# Patient Record
Sex: Female | Born: 2000 | Race: White | Hispanic: No | Marital: Single | Smoking: Never smoker
Health system: Southern US, Community
[De-identification: ages and names within clinical notes are randomized; demographics above are authoritative.]

## PROBLEM LIST (undated history)

## (undated) DIAGNOSIS — F329 Major depressive disorder, single episode, unspecified: Secondary | ICD-10-CM

## (undated) DIAGNOSIS — F4310 Post-traumatic stress disorder, unspecified: Secondary | ICD-10-CM

## (undated) DIAGNOSIS — F419 Anxiety disorder, unspecified: Secondary | ICD-10-CM

## (undated) DIAGNOSIS — J45909 Unspecified asthma, uncomplicated: Secondary | ICD-10-CM

## (undated) DIAGNOSIS — F32A Depression, unspecified: Secondary | ICD-10-CM

## (undated) HISTORY — PX: ADENOIDECTOMY: SUR15

## (undated) HISTORY — DX: Anxiety disorder, unspecified: F41.9

## (undated) HISTORY — DX: Depression, unspecified: F32.A

## (undated) HISTORY — DX: Post-traumatic stress disorder, unspecified: F43.10

## (undated) HISTORY — PX: TONSILLECTOMY: SUR1361

---
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
## 2022-01-12 ENCOUNTER — Ambulatory Visit (INDEPENDENT_AMBULATORY_CARE_PROVIDER_SITE_OTHER): Payer: Medicaid Other | Admitting: Psychiatry

## 2022-01-12 DIAGNOSIS — F4310 Post-traumatic stress disorder, unspecified: Secondary | ICD-10-CM | POA: Diagnosis not present

## 2022-01-12 NOTE — Progress Notes (Signed)
Virtual Visit via Video Note ? ?I connected with Tabitha Fitzpatrick on 01/12/22 at 1:08 PM EDT by a video enabled telemedicine application and verified that I am speaking with the correct person using two identifiers. ? ?Location: ?Patient:Home ?Provider: [facility] [place] office  ?  ?I discussed the limitations of evaluation and management by telemedicine and the availability of in person appointments. The patient expressed understanding and agreed to proceed. ? ? ?I provided 50 minutes of non-face-to-face time during this encounter. ? ? ?Leilanie Rauda E Filiberto Wamble, LCSW ? ? ? ?THERAPIST PROGRESS NOTE ? ?Session Time: Monday 01/12/2022  1:08 PM - 1:58 PM ? ?Participation Level: Active ? ?Behavioral Response: CasualAlertAnxious and Depressed ? ?Type of Therapy: Individual Therapy ? ?Treatment Goals addressed: [name] will score less than 10 on the patient health questionnaire/[name] will participate in pleasant activities 3 times per week for the next 12 weeks ? ?ProgressTowards Goals: not progressing ? ?Interventions: CBT and Supportive ? ?Summary: Tabitha Fitzpatrick is a 21 y.o. female who is referred for services by psychiatrist Dr. Tenny Craw due to patient experiencing anxiety, stress, and effects of trauma history she denies any psychiatric hospitalizations.  She participated [facility] and last was seen about 4 years ago.  Patient states needing someone to talk to due to anxiety and depression.  She reports stress related to her current boyfriend.  Current symptoms include crying spells, staying in bed at times, and social withdrawal. ? ?Patient last was seen via virtual visit about 2 weeks ago.  Patient reports increased symptoms of depression as reflected in PHQ-9.  Per patient's report, trigger is negative comments from her ex-boyfriend.  Patient reports this triggered increased negative thoughts about self.  She verbalizes thoughts of not being good enough.  She reports these thoughts also are  triggered by her parents expectations of her now that she is an adult.  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  She reports continued regular attendance at work but reports little to no involvement in other activity.   ? ?Suicidal/Homicidal: Nowithout intent/plan ? ?Therapist Response: Reviewed symptoms, administered PHQ-9, assisted patient identify triggers of increased symptoms of depression, discussed stressors, facilitated expression of thoughts and feelings, validated feelings, began to assist patient identify the connection between thoughts/mood/behavior using examples from her life, began to assist patient examine her thought patterns and effects on her current functioning, discussed role of behavioral activation in overcoming depression, developed plan with patient to participate in 1 pleasant activity per day 5 days/week, therapist will send patient activity menu handout to assist patient in her efforts, also discussed the role of medication in coping with depression, developed plan with patient to schedule medication management appointment with psychiatrist Dr. Tenny Craw  ?Plan: Return again in 2 weeks. ? ?Diagnosis: PTSD (post-traumatic stress disorder) ? ?Collaboration of Care: Psychiatrist AEB by patient working with psychiatrist Dr. Tenny Craw, clinician encouraging patient to schedule follow-up appointment. ? ?Patient/Guardian was advised Release of Information must be obtained prior to any record release in order to collaborate their care with an outside provider. Patient/Guardian was advised if they have not already done so to contact the registration department to sign all necessary forms in order for [place] to release information regarding their care.  ? ?Consent: Patient/Guardian gives verbal consent for treatment and assignment of benefits for services provided during this visit. Patient/Guardian expressed  understanding and agreed to proceed.  ? ?  Shamina Etheridge E Tal Neer, LCSW ?01/12/2022 ? ?

## 2022-01-26 ENCOUNTER — Ambulatory Visit (HOSPITAL_COMMUNITY): Payer: Medicaid Other | Admitting: Psychiatry

## 2022-01-26 ENCOUNTER — Telehealth (HOSPITAL_COMMUNITY): Payer: Self-pay | Admitting: Psychiatry

## 2022-01-26 NOTE — Telephone Encounter (Signed)
Therapist attempted to contact patient via text through caregility platform for scheduled appointment, no response.  Therapist left message indicating attempt and requesting patient call office. ?

## 2022-02-03 ENCOUNTER — Encounter: Payer: Self-pay | Admitting: Cardiology

## 2022-02-05 ENCOUNTER — Encounter: Payer: Self-pay | Admitting: *Deleted

## 2022-02-13 ENCOUNTER — Telehealth: Payer: Self-pay | Admitting: Cardiology

## 2022-02-13 NOTE — Telephone Encounter (Signed)
Pt calling to f/u on heart monitor results. Pt states that she also has been experiencing light headedness as well. Please advise ?

## 2022-02-13 NOTE — Telephone Encounter (Signed)
Addressed in results encounter. 

## 2022-02-13 NOTE — Telephone Encounter (Signed)
Addressed in result note.  

## 2022-02-13 NOTE — Telephone Encounter (Signed)
Left message for patient to call office.  

## 2022-07-01 NOTE — Progress Notes (Signed)
No show

## 2022-09-02 ENCOUNTER — Ambulatory Visit
Admission: EM | Admit: 2022-09-02 | Discharge: 2022-09-02 | Disposition: A | Discharge status: Home / Self Care | Payer: PRIVATE HEALTH INSURANCE | Attending: Nurse Practitioner | Admitting: Nurse Practitioner

## 2022-09-02 DIAGNOSIS — J029 Acute pharyngitis, unspecified: Secondary | ICD-10-CM | POA: Diagnosis present

## 2022-09-02 DIAGNOSIS — B001 Herpesviral vesicular dermatitis: Secondary | ICD-10-CM | POA: Diagnosis not present

## 2022-09-02 DIAGNOSIS — J069 Acute upper respiratory infection, unspecified: Secondary | ICD-10-CM | POA: Insufficient documentation

## 2022-09-02 LAB — POCT RAPID STREP A (OFFICE): Rapid Strep A Screen: NEGATIVE

## 2022-09-02 MED ORDER — LIDOCAINE VISCOUS HCL 2 % MT SOLN: 10.0000 mL | Freq: Four times a day (QID) | OROMUCOSAL | 0 refills | Status: DC | PRN

## 2022-09-02 MED ORDER — DEXAMETHASONE SODIUM PHOSPHATE 10 MG/ML IJ SOLN
10.0000 mg | INTRAMUSCULAR | Status: AC
  Administered 2022-09-02: 10 mg via INTRAMUSCULAR

## 2022-09-02 MED ORDER — VALACYCLOVIR HCL 1 G PO TABS: 2.0000 g | ORAL_TABLET | Freq: Once | ORAL | 0 refills | Status: AC

## 2022-09-02 MED ORDER — ALBUTEROL SULFATE HFA 108 (90 BASE) MCG/ACT IN AERS: 2.0000 | INHALATION_SPRAY | Freq: Four times a day (QID) | RESPIRATORY_TRACT | 2 refills | Status: DC | PRN

## 2022-09-02 MED ORDER — PREDNISONE 20 MG PO TABS: 40.0000 mg | ORAL_TABLET | Freq: Every day | ORAL | 0 refills | Status: AC

## 2022-09-02 NOTE — ED Provider Notes (Signed)
[facility]    CSN: [id number] Arrival date & time: 09/02/22  1256      History   Chief Complaint Chief Complaint  Patient presents with   Sore Throat    I have a sore throat, congestion, been running fevers, my o2 levels have been around 96. I have been coughing.  I woke this am with mouth blisters.  I have been test for flu, strep and Covid.  They have all been negative.  I'm still sick. - Entered by patient    HPI Tabitha Fitzpatrick is a 21 y.o. female.   The history is provided by the patient and a parent.   Patient presents with her mother for complaints of fever, sore throat, cough, mouth and tongue pain, that is been present for the past week.  Patient's dates she was seen by her PCP and started on penicillin for her symptoms.  Patient states her symptoms are getting worse.  She states her last fever was approximately 2 days ago.  She states that this morning when she woke up, she had difficulty catching her breath.  She states that she was given a steroid shot in the office as well, which she did feel better, but then started feeling worse.  She states that she tried to eat last evening, but cannot.  Patient states that she has difficulty eating and drinking, and opening her mouth. Past Medical History:  Diagnosis Date   Anxiety    Asthma    mild, no inhalers at home   Depression    PTSD (post-traumatic stress disorder)     Patient Active Problem List   Diagnosis Date Noted   Anxiety 01/13/2018   Hives 01/13/2018   Irregular menses 01/13/2018   Scoliosis concern 01/13/2018   Colles' fracture of right radius 05/11/2013   Hypovolemia dehydration 07/06/2012    Past Surgical History:  Procedure Laterality Date   ADENOIDECTOMY     age 58 years   TONSILLECTOMY     age 45 years    OB History   No obstetric history on file.      Home Medications    Prior to Admission medications   Medication Sig Start Date End Date Taking? Authorizing Provider   albuterol (VENTOLIN HFA) 108 (90 Base) MCG/ACT inhaler Inhale 2 puffs into the lungs every 6 (six) hours as needed for wheezing or shortness of breath. 09/02/22  Yes Airel Magadan-Warren, Sadie Haber, NP  lidocaine (XYLOCAINE) 2 % solution Use as directed 10 mLs in the mouth or throat every 6 (six) hours as needed for mouth pain. 09/02/22  Yes Fany Cavanaugh-Warren, Sadie Haber, NP  predniSONE (DELTASONE) 20 MG tablet Take 2 tablets (40 mg total) by mouth daily with breakfast for 5 days. 09/02/22 09/07/22 Yes Catheryn Slifer-Warren, Sadie Haber, NP  valACYclovir (VALTREX) 1000 MG tablet Take 2 tablets (2,000 mg total) by mouth once for 1 dose. 09/02/22 09/02/22 Yes Macey Wurtz-Warren, Sadie Haber, NP  hydrOXYzine (ATARAX/VISTARIL) 10 MG tablet Take 1 tablet (10 mg total) by mouth 3 (three) times daily as needed for anxiety. 08/14/21   Myrlene Broker, MD  prazosin (MINIPRESS) 5 MG capsule Take 1 capsule (5 mg total) by mouth at bedtime. 08/14/21   Myrlene Broker, MD  sertraline (ZOLOFT) 50 MG tablet Take 1.5 tablets (75 mg total) by mouth daily. 08/14/21 08/14/22  Myrlene Broker, MD    Family History Family History  Problem Relation Age of Onset   Cancer Mother  cervical cancer - remission now, hysterectomy   Anxiety disorder Mother    Hypertension Father        father also has reflux   Diabetes Maternal Grandmother    Hypertension Maternal Grandmother    Diabetes Paternal Grandmother    Hypertension Paternal Grandmother     Social History Social History   Tobacco Use   Smoking status: Never   Smokeless tobacco: Never   Tobacco comments:    No smoker in the home, no alcohol either.  Substance Use Topics   Alcohol use: No   Drug use: No     Allergies   Patient has no known allergies.   Review of Systems Review of Systems Per HPI  Physical Exam Triage Vital Signs ED Triage Vitals [09/02/22 1439]  Enc Vitals Group     BP 129/81     Pulse Rate (!) 126     Resp 20     Temp 99.7 F (37.6 C)      Temp Source Oral     SpO2 98 %     Weight      Height      Head Circumference      Peak Flow      Pain Score      Pain Loc      Pain Edu?      Excl. in GC?    No data found.  Updated Vital Signs BP 129/81 (BP Location: Right Arm)   Pulse (!) 126   Temp 99.7 F (37.6 C) (Oral)   Resp 20   LMP 08/27/2022 (Exact Date)   SpO2 98%   Visual Acuity Right Eye Distance:   Left Eye Distance:   Bilateral Distance:    Right Eye Near:   Left Eye Near:    Bilateral Near:     Physical Exam Vitals and nursing note reviewed.  Constitutional:      General: She is not in acute distress.    Appearance: She is well-developed.  HENT:     Head: Normocephalic.     Right Ear: Tympanic membrane and ear canal normal.     Left Ear: Tympanic membrane and ear canal normal.     Nose: No congestion or rhinorrhea.     Mouth/Throat:     Lips: Lesions present.     Mouth: Oral lesions present.     Dentition: Gum lesions present.     Tongue: Lesions present.     Pharynx: Uvula midline. No oropharyngeal exudate.  Cardiovascular:     Rate and Rhythm: Normal rate and regular rhythm.     Heart sounds: Normal heart sounds.  Pulmonary:     Effort: Pulmonary effort is normal.     Breath sounds: Normal breath sounds.  Abdominal:     General: Bowel sounds are normal.     Palpations: Abdomen is soft.     Tenderness: There is no abdominal tenderness.  Musculoskeletal:     Cervical back: Normal range of motion.  Lymphadenopathy:     Cervical: Cervical adenopathy present.  Skin:    General: Skin is warm and dry.  Neurological:     General: No focal deficit present.     Mental Status: She is alert and oriented to person, place, and time.  Psychiatric:        Mood and Affect: Mood normal.        Behavior: Behavior normal.      UC Treatments / Results  Labs (all labs ordered are  listed, but only abnormal results are displayed) Labs Reviewed  CULTURE, GROUP A STREP [facility]  POCT RAPID STREP A  (OFFICE)    EKG   Radiology No results found.  Procedures Procedures (including critical care time)  Medications Ordered in UC Medications  dexamethasone (DECADRON) injection 10 mg (10 mg Intramuscular Given 09/02/22 1546)    Initial Impression / Assessment and Plan / UC Course  I have reviewed the triage vital signs and the nursing notes.  Pertinent labs & imaging results that were available during my care of the patient were reviewed by me and considered in my medical decision making (see chart for details).  Patient presents for continued sore throat, cough, fever, and congestion.  She has also developed new oral lesions.  Patient's vital signs are stable, although she is tachycardic, she is in no acute distress.  Patient is currently taking penicillin for her symptoms.  This is the first-line treatment recommendation for the patient's symptoms, rapid strep test was repeated along with a throat culture that is pending.  Patient was advised that no changes to her medication at this time.  For her throat pain and discomfort, viscous lidocaine 2% was prescribed, and prednisone 40 mg was prescribed to help with throat pain and inflammation.  For her oral lesions, patient was prescribed valacyclovir 2 g.  For the patient's cough, she was prescribed an albuterol inhaler.  Supportive care recommendations were provided to the patient.  Patient verbalizes understanding.  All questions were answered.  Patient advised to follow-up with her primary care physician if symptoms do not improve with this treatment.  Patient is stable for discharge. Final Clinical Impressions(s) / UC Diagnoses   Final diagnoses:  Acute pharyngitis, unspecified etiology  Herpes labialis without complication  Acute upper respiratory infection     Discharge Instructions      The rapid strep test is negative.  A throat culture has been ordered.  There is a likelihood or chance that the throat culture will be negative  since she is currently taking penicillin. The most appropriate treatment at this time as far as antibiotic therapy is to continue the penicillin she is currently taking.  I have provided symptomatic treatment for her today. Take medications as prescribed. Increase fluids and allow for plenty of rest. Recommend a soft diet while symptoms persist, this includes soup, broth, yogurt, pudding, Jell-O, and popsicles. Warm salt water gargles 3-4 times daily while symptoms persist.  This is also helpful for you to use to help with mouth pain or discomfort. If the symptoms continue to persist after you have completed the antibiotic treatment, please follow-up with your primary care physician for further evaluation. Follow-up as needed.     ED Prescriptions     Medication Sig Dispense Auth. Provider   predniSONE (DELTASONE) 20 MG tablet Take 2 tablets (40 mg total) by mouth daily with breakfast for 5 days. 10 tablet Samai Corea-Warren, Sadie Haberhristie J, NP   lidocaine (XYLOCAINE) 2 % solution Use as directed 10 mLs in the mouth or throat every 6 (six) hours as needed for mouth pain. 100 mL Jasan Doughtie-Warren, Sadie Haberhristie J, NP   albuterol (VENTOLIN HFA) 108 (90 Base) MCG/ACT inhaler Inhale 2 puffs into the lungs every 6 (six) hours as needed for wheezing or shortness of breath. 8 g Tyreon Frigon-Warren, Sadie Haberhristie J, NP   valACYclovir (VALTREX) 1000 MG tablet Take 2 tablets (2,000 mg total) by mouth once for 1 dose. 2 tablet Damario Gillie-Warren, Sadie Haberhristie J, NP      PDMP  not reviewed this encounter.   Abran Cantor, NP 09/02/22 [phone]

## 2022-09-02 NOTE — Discharge Instructions (Addendum)
The rapid strep test is negative.  A throat culture has been ordered.  There is a likelihood or chance that the throat culture will be negative since she is currently taking penicillin. The most appropriate treatment at this time as far as antibiotic therapy is to continue the penicillin she is currently taking.  I have provided symptomatic treatment for her today. Take medications as prescribed. Increase fluids and allow for plenty of rest. Recommend a soft diet while symptoms persist, this includes soup, broth, yogurt, pudding, Jell-O, and popsicles. Warm salt water gargles 3-4 times daily while symptoms persist.  This is also helpful for you to use to help with mouth pain or discomfort. If the symptoms continue to persist after you have completed the antibiotic treatment, please follow-up with your primary care physician for further evaluation. Follow-up as needed.

## 2022-09-02 NOTE — ED Triage Notes (Signed)
Pt has a sore throat, congestion, been running fevers, her o2 levels have been around 96. She been coughing. She woke up with blisters on her tongue and upper mouth., . She has been test for flu, strep and Covid. They have all been negative. I'm still sick.  This has been going on for almost a week. Took penicillin but no relief.

## 2022-09-05 LAB — CULTURE, GROUP A STREP (THRC)

## 2022-09-30 ENCOUNTER — Emergency Department (HOSPITAL_COMMUNITY)
Admission: EM | Admit: 2022-09-30 | Discharge: 2022-09-30 | Discharge status: Against Medical Advice | Payer: PRIVATE HEALTH INSURANCE | Attending: Emergency Medicine | Admitting: Emergency Medicine

## 2022-09-30 ENCOUNTER — Other Ambulatory Visit: Payer: Self-pay

## 2022-09-30 ENCOUNTER — Encounter (HOSPITAL_COMMUNITY): Payer: Self-pay

## 2022-09-30 DIAGNOSIS — R1032 Left lower quadrant pain: Secondary | ICD-10-CM | POA: Diagnosis not present

## 2022-09-30 DIAGNOSIS — Z5321 Procedure and treatment not carried out due to patient leaving prior to being seen by health care provider: Secondary | ICD-10-CM | POA: Insufficient documentation

## 2022-09-30 NOTE — ED Notes (Signed)
Not in waiting area.  

## 2022-09-30 NOTE — ED Triage Notes (Signed)
Patient states left sided flank pain that feels like a kidney stone. Patient states she had this same feeling a couple months ago and states it is hard for her to walk or stand up straight.

## 2022-11-09 ENCOUNTER — Emergency Department (HOSPITAL_COMMUNITY): Payer: PRIVATE HEALTH INSURANCE

## 2022-11-09 ENCOUNTER — Other Ambulatory Visit: Payer: Self-pay

## 2022-11-09 ENCOUNTER — Ambulatory Visit
Admission: RE | Admit: 2022-11-09 | Discharge: 2022-11-09 | Disposition: A | Discharge status: Home / Self Care | Payer: Medicaid Other | Source: Ambulatory Visit | Attending: Family Medicine | Admitting: Family Medicine

## 2022-11-09 ENCOUNTER — Inpatient Hospital Stay (HOSPITAL_COMMUNITY)
Admission: EM | Admit: 2022-11-09 | Discharge: 2022-11-12 | DRG: 872 | Disposition: A | Discharge status: Home / Self Care | Payer: PRIVATE HEALTH INSURANCE | Attending: Family Medicine | Admitting: Family Medicine

## 2022-11-09 ENCOUNTER — Observation Stay (HOSPITAL_COMMUNITY): Payer: PRIVATE HEALTH INSURANCE

## 2022-11-09 ENCOUNTER — Encounter (HOSPITAL_COMMUNITY): Payer: Self-pay

## 2022-11-09 VITALS — BP 121/87 | HR 139 | Temp 98.7°F | Resp 20

## 2022-11-09 DIAGNOSIS — Z818 Family history of other mental and behavioral disorders: Secondary | ICD-10-CM

## 2022-11-09 DIAGNOSIS — L923 Foreign body granuloma of the skin and subcutaneous tissue: Secondary | ICD-10-CM

## 2022-11-09 DIAGNOSIS — D72829 Elevated white blood cell count, unspecified: Secondary | ICD-10-CM | POA: Diagnosis not present

## 2022-11-09 DIAGNOSIS — L0390 Cellulitis, unspecified: Secondary | ICD-10-CM | POA: Diagnosis present

## 2022-11-09 DIAGNOSIS — Z0389 Encounter for observation for other suspected diseases and conditions ruled out: Secondary | ICD-10-CM | POA: Diagnosis not present

## 2022-11-09 DIAGNOSIS — N939 Abnormal uterine and vaginal bleeding, unspecified: Secondary | ICD-10-CM

## 2022-11-09 DIAGNOSIS — R000 Tachycardia, unspecified: Secondary | ICD-10-CM

## 2022-11-09 DIAGNOSIS — F4310 Post-traumatic stress disorder, unspecified: Secondary | ICD-10-CM | POA: Diagnosis present

## 2022-11-09 DIAGNOSIS — G90A Postural orthostatic tachycardia syndrome (POTS): Secondary | ICD-10-CM | POA: Diagnosis present

## 2022-11-09 DIAGNOSIS — L03114 Cellulitis of left upper limb: Secondary | ICD-10-CM

## 2022-11-09 DIAGNOSIS — Z1152 Encounter for screening for COVID-19: Secondary | ICD-10-CM

## 2022-11-09 DIAGNOSIS — M79602 Pain in left arm: Secondary | ICD-10-CM | POA: Diagnosis not present

## 2022-11-09 DIAGNOSIS — Z8049 Family history of malignant neoplasm of other genital organs: Secondary | ICD-10-CM

## 2022-11-09 DIAGNOSIS — Z833 Family history of diabetes mellitus: Secondary | ICD-10-CM

## 2022-11-09 DIAGNOSIS — J45909 Unspecified asthma, uncomplicated: Secondary | ICD-10-CM | POA: Diagnosis present

## 2022-11-09 DIAGNOSIS — Z8249 Family history of ischemic heart disease and other diseases of the circulatory system: Secondary | ICD-10-CM

## 2022-11-09 DIAGNOSIS — Z28310 Unvaccinated for covid-19: Secondary | ICD-10-CM

## 2022-11-09 DIAGNOSIS — A419 Sepsis, unspecified organism: Principal | ICD-10-CM | POA: Diagnosis present

## 2022-11-09 DIAGNOSIS — R112 Nausea with vomiting, unspecified: Secondary | ICD-10-CM

## 2022-11-09 DIAGNOSIS — D649 Anemia, unspecified: Secondary | ICD-10-CM | POA: Diagnosis present

## 2022-11-09 DIAGNOSIS — R6510 Systemic inflammatory response syndrome (SIRS) of non-infectious origin without acute organ dysfunction: Secondary | ICD-10-CM | POA: Diagnosis present

## 2022-11-09 DIAGNOSIS — N390 Urinary tract infection, site not specified: Secondary | ICD-10-CM | POA: Insufficient documentation

## 2022-11-09 DIAGNOSIS — L818 Other specified disorders of pigmentation: Secondary | ICD-10-CM | POA: Diagnosis present

## 2022-11-09 DIAGNOSIS — R600 Localized edema: Secondary | ICD-10-CM | POA: Diagnosis not present

## 2022-11-09 LAB — RESP PANEL BY RT-PCR (RSV, FLU A&B, COVID)  RVPGX2
Influenza A by PCR: NEGATIVE
Influenza B by PCR: NEGATIVE
Resp Syncytial Virus by PCR: NEGATIVE
SARS Coronavirus 2 by RT PCR: NEGATIVE

## 2022-11-09 LAB — COMPREHENSIVE METABOLIC PANEL
ALT: 15 U/L (ref 0–44)
AST: 17 U/L (ref 15–41)
Albumin: 4.3 g/dL (ref 3.5–5.0)
Alkaline Phosphatase: 44 U/L (ref 38–126)
Anion gap: 13 (ref 5–15)
BUN: 15 mg/dL (ref 6–20)
CO2: 19 mmol/L — ABNORMAL LOW (ref 22–32)
Calcium: 8.7 mg/dL — ABNORMAL LOW (ref 8.9–10.3)
Chloride: 103 mmol/L (ref 98–111)
Creatinine, Ser: 0.77 mg/dL (ref 0.44–1.00)
GFR, Estimated: >60 mL/min (ref >60)
Glucose, Bld: 82 mg/dL (ref 70–99)
Potassium: 3.6 mmol/L (ref 3.5–5.1)
Sodium: 135 mmol/L (ref 135–145)
Total Bilirubin: 2 mg/dL — ABNORMAL HIGH (ref 0.3–1.2)
Total Protein: 7.5 g/dL (ref 6.5–8.1)

## 2022-11-09 LAB — URINALYSIS, W/ REFLEX TO CULTURE (INFECTION SUSPECTED)
Bilirubin Urine: NEGATIVE
Glucose, UA: NEGATIVE mg/dL
Ketones, ur: 80 mg/dL — AB
Leukocytes,Ua: NEGATIVE
Nitrite: NEGATIVE
Protein, ur: 100 mg/dL — AB
RBC / HPF: >50 RBC/hpf (ref 0–5)
Specific Gravity, Urine: 1.025 (ref 1.005–1.030)
pH: 5 (ref 5.0–8.0)

## 2022-11-09 LAB — POCT URINALYSIS DIP (MANUAL ENTRY)
Glucose, UA: NEGATIVE mg/dL
Leukocytes, UA: NEGATIVE
Nitrite, UA: NEGATIVE
Protein Ur, POC: 30 mg/dL — AB
Spec Grav, UA: >=1.03 — AB (ref 1.010–1.025)
Urobilinogen, UA: 1 E.U./dL
pH, UA: 5.5 (ref 5.0–8.0)

## 2022-11-09 LAB — CBC WITH DIFFERENTIAL/PLATELET
Abs Immature Granulocytes: 0.03 10*3/uL (ref 0.00–0.07)
Basophils Absolute: 0 10*3/uL (ref 0.0–0.1)
Basophils Relative: 0 %
Eosinophils Absolute: 0 10*3/uL (ref 0.0–0.5)
Eosinophils Relative: 0 %
HCT: 41.8 % (ref 36.0–46.0)
Hemoglobin: 13.6 g/dL (ref 12.0–15.0)
Immature Granulocytes: 0 %
Lymphocytes Relative: 5 %
Lymphs Abs: 0.6 10*3/uL — ABNORMAL LOW (ref 0.7–4.0)
MCH: 33.1 pg (ref 26.0–34.0)
MCHC: 32.5 g/dL (ref 30.0–36.0)
MCV: 101.7 fL — ABNORMAL HIGH (ref 80.0–100.0)
Monocytes Absolute: 0.7 10*3/uL (ref 0.1–1.0)
Monocytes Relative: 6 %
Neutro Abs: 9.6 10*3/uL — ABNORMAL HIGH (ref 1.7–7.7)
Neutrophils Relative %: 89 %
Platelets: 168 10*3/uL (ref 150–400)
RBC: 4.11 MIL/uL (ref 3.87–5.11)
RDW: 12 % (ref 11.5–15.5)
WBC: 10.9 10*3/uL — ABNORMAL HIGH (ref 4.0–10.5)
nRBC: 0 % (ref 0.0–0.2)

## 2022-11-09 LAB — APTT: aPTT: 24 seconds (ref 24–36)

## 2022-11-09 LAB — LACTIC ACID, PLASMA
Lactic Acid, Venous: 1.5 mmol/L (ref 0.5–1.9)
Lactic Acid, Venous: 1.8 mmol/L (ref 0.5–1.9)

## 2022-11-09 LAB — PROTIME-INR
INR: 1.2 (ref 0.8–1.2)
Prothrombin Time: 14.9 seconds (ref 11.4–15.2)

## 2022-11-09 LAB — POCT URINE PREGNANCY: Preg Test, Ur: NEGATIVE

## 2022-11-09 LAB — POC URINE PREG, ED: Preg Test, Ur: NEGATIVE

## 2022-11-09 MED ORDER — CEFTRIAXONE SODIUM 500 MG IJ SOLR
500.0000 mg | Freq: Once | INTRAMUSCULAR | Status: AC
Start: 2022-11-09 — End: 2022-11-09
  Administered 2022-11-09: 500 mg via INTRAMUSCULAR

## 2022-11-09 MED ORDER — MORPHINE SULFATE (PF) 2 MG/ML IV SOLN
2.0000 mg | INTRAVENOUS | Status: DC | PRN
  Filled 2022-11-09: qty 1

## 2022-11-09 MED ORDER — FENTANYL CITRATE PF 50 MCG/ML IJ SOSY
50.0000 ug | PREFILLED_SYRINGE | Freq: Once | INTRAMUSCULAR | Status: AC
  Administered 2022-11-09: 50 ug via INTRAVENOUS
  Filled 2022-11-09: qty 1

## 2022-11-09 MED ORDER — LACTATED RINGERS IV BOLUS (SEPSIS)
1000.0000 mL | Freq: Once | INTRAVENOUS | Status: AC
  Administered 2022-11-09: 1000 mL via INTRAVENOUS

## 2022-11-09 MED ORDER — ONDANSETRON HCL 4 MG PO TABS
4.0000 mg | ORAL_TABLET | Freq: Four times a day (QID) | ORAL | Status: DC | PRN
  Administered 2022-11-12: 4 mg via ORAL
  Filled 2022-11-09: qty 1

## 2022-11-09 MED ORDER — SODIUM CHLORIDE 0.9 % IV SOLN
1.0000 g | INTRAVENOUS | Status: DC
  Administered 2022-11-10 – 2022-11-12 (×3): 1 g via INTRAVENOUS
  Filled 2022-11-09 (×3): qty 10

## 2022-11-09 MED ORDER — VANCOMYCIN HCL IN DEXTROSE 1-5 GM/200ML-% IV SOLN
1000.0000 mg | Freq: Once | INTRAVENOUS | Status: AC
  Administered 2022-11-09: 1000 mg via INTRAVENOUS
  Filled 2022-11-09: qty 200

## 2022-11-09 MED ORDER — LACTATED RINGERS IV SOLN: INTRAVENOUS | Status: AC

## 2022-11-09 MED ORDER — INFLUENZA VAC SPLIT QUAD 0.5 ML IM SUSY: 0.5000 mL | PREFILLED_SYRINGE | INTRAMUSCULAR | Status: DC

## 2022-11-09 MED ORDER — POLYETHYLENE GLYCOL 3350 17 G PO PACK: 17.0000 g | PACK | Freq: Every day | ORAL | Status: DC | PRN

## 2022-11-09 MED ORDER — TETANUS-DIPHTH-ACELL PERTUSSIS 5-2.5-18.5 LF-MCG/0.5 IM SUSY
0.5000 mL | PREFILLED_SYRINGE | Freq: Once | INTRAMUSCULAR | Status: AC
  Administered 2022-11-09: 0.5 mL via INTRAMUSCULAR

## 2022-11-09 MED ORDER — HEPARIN SODIUM (PORCINE) 5000 UNIT/ML IJ SOLN
5000.0000 [IU] | Freq: Three times a day (TID) | INTRAMUSCULAR | Status: DC
  Filled 2022-11-09 (×2): qty 1

## 2022-11-09 MED ORDER — ACETAMINOPHEN 325 MG PO TABS
650.0000 mg | ORAL_TABLET | Freq: Four times a day (QID) | ORAL | Status: DC | PRN
  Administered 2022-11-10 – 2022-11-12 (×4): 650 mg via ORAL
  Filled 2022-11-09 (×4): qty 2

## 2022-11-09 MED ORDER — ONDANSETRON HCL 4 MG/2ML IJ SOLN
4.0000 mg | Freq: Four times a day (QID) | INTRAMUSCULAR | Status: DC | PRN
  Administered 2022-11-10 – 2022-11-11 (×3): 4 mg via INTRAVENOUS
  Filled 2022-11-09 (×4): qty 2

## 2022-11-09 MED ORDER — SODIUM CHLORIDE 0.9 % IV SOLN
2.0000 g | Freq: Once | INTRAVENOUS | Status: AC
  Administered 2022-11-09: 2 g via INTRAVENOUS
  Filled 2022-11-09: qty 12.5

## 2022-11-09 MED ORDER — DOXYCYCLINE HYCLATE 100 MG PO CAPS: 100.0000 mg | ORAL_CAPSULE | Freq: Two times a day (BID) | ORAL | 0 refills | Status: DC

## 2022-11-09 MED ORDER — ONDANSETRON HCL 4 MG/2ML IJ SOLN
4.0000 mg | Freq: Once | INTRAMUSCULAR | Status: AC
  Administered 2022-11-09: 4 mg via INTRAVENOUS
  Filled 2022-11-09: qty 2

## 2022-11-09 MED ORDER — IOHEXOL 300 MG/ML  SOLN
75.0000 mL | Freq: Once | INTRAMUSCULAR | Status: AC | PRN
  Administered 2022-11-09: 75 mL via INTRAVENOUS

## 2022-11-09 MED ORDER — OXYCODONE HCL 5 MG PO TABS
5.0000 mg | ORAL_TABLET | ORAL | Status: DC | PRN
  Administered 2022-11-11: 5 mg via ORAL
  Filled 2022-11-09: qty 1

## 2022-11-09 MED ORDER — ACETAMINOPHEN 650 MG RE SUPP: 650.0000 mg | Freq: Four times a day (QID) | RECTAL | Status: DC | PRN

## 2022-11-09 MED ORDER — METRONIDAZOLE 500 MG/100ML IV SOLN
500.0000 mg | Freq: Once | INTRAVENOUS | Status: AC
  Administered 2022-11-09: 500 mg via INTRAVENOUS
  Filled 2022-11-09: qty 100

## 2022-11-09 MED ORDER — LACTATED RINGERS IV BOLUS (SEPSIS): 250.0000 mL | Freq: Once | INTRAVENOUS | Status: DC

## 2022-11-09 MED ORDER — ONDANSETRON 4 MG PO TBDP
4.0000 mg | ORAL_TABLET | Freq: Once | ORAL | Status: AC
  Administered 2022-11-09: 4 mg via ORAL

## 2022-11-09 MED ORDER — ONDANSETRON 4 MG PO TBDP: 4.0000 mg | ORAL_TABLET | Freq: Three times a day (TID) | ORAL | 0 refills | Status: DC | PRN

## 2022-11-09 MED ORDER — KETOROLAC TROMETHAMINE 30 MG/ML IJ SOLN
15.0000 mg | Freq: Once | INTRAMUSCULAR | Status: AC
  Administered 2022-11-09: 15 mg via INTRAVENOUS
  Filled 2022-11-09: qty 1

## 2022-11-09 NOTE — Sepsis Progress Note (Signed)
Sepsis protocol is being followed by [facility]. 

## 2022-11-09 NOTE — ED Provider Notes (Signed)
[facility]    CSN: [id number] Arrival date & time: 11/09/22  1312      History   Chief Complaint Chief Complaint  Patient presents with   Nausea    Blood in vomit, other issues - Entered by patient    HPI Tabitha Fitzpatrick is a 22 y.o. female.   Presenting today with multiple concerns.  She states she is been having headaches, lightheadedness worse with standing for the past week.  She states she does have a history of POTS but that she is feeling this way somewhat in all positions at this time and that her heart rate usually is between 110-120 and it has been higher within this time.  Denies shortness of breath, palpitations, severe chest pain, diaphoresis associated.  Not tried anything for the symptoms.  States she feels like she is eating and drinking her typical amount.  She also recently got a tattoo to her left forearm about 4 or 5 days ago in the past 3 days the area has become significantly red, swollen, severely painful.  No bleeding or drainage, no appreciable fevers or diaphoresis.  Not tried anything for the symptoms.  She also started having nausea and vomiting as well as some vaginal spotting yesterday.  Last menstrual period was 10/28/2022.  History of irregular menses, not currently on any sort of contraception and has had unprotected intercourse recently.  Tolerating p.o. fluids well today.    Past Medical History:  Diagnosis Date   Anxiety    Asthma    mild, no inhalers at home   Depression    PTSD (post-traumatic stress disorder)     Patient Active Problem List   Diagnosis Date Noted   Anxiety 01/13/2018   Hives 01/13/2018   Irregular menses 01/13/2018   Scoliosis concern 01/13/2018   Colles' fracture of right radius 05/11/2013   Hypovolemia dehydration 07/06/2012    Past Surgical History:  Procedure Laterality Date   ADENOIDECTOMY     age 67 years   TONSILLECTOMY     age 31 years    OB History   No obstetric history on file.       Home Medications    Prior to Admission medications   Medication Sig Start Date End Date Taking? Authorizing Provider  doxycycline (VIBRAMYCIN) 100 MG capsule Take 1 capsule (100 mg total) by mouth 2 (two) times daily. 11/09/22  Yes Volney American, PA-C  ondansetron (ZOFRAN-ODT) 4 MG disintegrating tablet Take 1 tablet (4 mg total) by mouth every 8 (eight) hours as needed for nausea or vomiting. 11/09/22  Yes Volney American, PA-C  albuterol (VENTOLIN HFA) 108 (90 Base) MCG/ACT inhaler Inhale 2 puffs into the lungs every 6 (six) hours as needed for wheezing or shortness of breath. 09/02/22   Leath-Warren, Alda Lea, NP  hydrOXYzine (ATARAX/VISTARIL) 10 MG tablet Take 1 tablet (10 mg total) by mouth 3 (three) times daily as needed for anxiety. 08/14/21   Cloria Spring, MD  lidocaine (XYLOCAINE) 2 % solution Use as directed 10 mLs in the mouth or throat every 6 (six) hours as needed for mouth pain. 09/02/22   Leath-Warren, Alda Lea, NP  prazosin (MINIPRESS) 5 MG capsule Take 1 capsule (5 mg total) by mouth at bedtime. 08/14/21   Cloria Spring, MD  sertraline (ZOLOFT) 50 MG tablet Take 1.5 tablets (75 mg total) by mouth daily. 08/14/21 08/14/22  Cloria Spring, MD    Family History Family History  Problem Relation Age  of Onset   Cancer Mother        cervical cancer - remission now, hysterectomy   Anxiety disorder Mother    Hypertension Father        father also has reflux   Diabetes Maternal Grandmother    Hypertension Maternal Grandmother    Diabetes Paternal Grandmother    Hypertension Paternal Grandmother     Social History Social History   Tobacco Use   Smoking status: Never   Smokeless tobacco: Never   Tobacco comments:    No smoker in the home, no alcohol either.  Substance Use Topics   Alcohol use: No   Drug use: No     Allergies   Patient has no known allergies.   Review of Systems Review of Systems Per HPI  Physical Exam Triage Vital  Signs ED Triage Vitals  Enc Vitals Group     BP 11/09/22 1342 121/87     Pulse Rate 11/09/22 1342 (!) 139     Resp 11/09/22 1342 20     Temp 11/09/22 1342 98.7 F (37.1 C)     Temp Source 11/09/22 1342 Oral     SpO2 11/09/22 1342 98 %     Weight --      Height --      Head Circumference --      Peak Flow --      Pain Score 11/09/22 1345 0     Pain Loc --      Pain Edu? --      Excl. in GC? --    Orthostatic VS for the past 24 hrs:  BP- Lying Pulse- Lying BP- Sitting Pulse- Sitting BP- Standing at 0 minutes Pulse- Standing at 0 minutes  11/09/22 1409 115/72 142 114/75 142 105/71 157    Updated Vital Signs BP 121/87 (BP Location: Right Arm)   Pulse (!) 139   Temp 98.7 F (37.1 C) (Oral)   Resp 20   LMP 10/28/2022   SpO2 98%   Visual Acuity Right Eye Distance:   Left Eye Distance:   Bilateral Distance:    Right Eye Near:   Left Eye Near:    Bilateral Near:     Physical Exam Vitals and nursing note reviewed.  Constitutional:      Appearance: Normal appearance. She is not ill-appearing.  HENT:     Head: Atraumatic.     Mouth/Throat:     Mouth: Mucous membranes are moist.  Eyes:     Extraocular Movements: Extraocular movements intact.     Conjunctiva/sclera: Conjunctivae normal.  Cardiovascular:     Rate and Rhythm: Regular rhythm. Tachycardia present.     Heart sounds: Normal heart sounds.  Pulmonary:     Effort: Pulmonary effort is normal.     Breath sounds: Normal breath sounds.  Abdominal:     General: Bowel sounds are normal. There is no distension.     Palpations: Abdomen is soft.     Tenderness: There is no abdominal tenderness. There is no guarding.  Musculoskeletal:        General: Swelling and tenderness present. Normal range of motion.     Cervical back: Normal range of motion and neck supple.  Skin:    General: Skin is warm.     Findings: Erythema present.     Comments: Erythematous, edematous region surrounding and underlying large tattoo  that extends throughout almost the entire left forearm flexor surface.  Significantly tender to palpation, no drainage or pustules present  Neurological:     Mental Status: She is alert and oriented to person, place, and time.     Motor: No weakness.     Gait: Gait normal.     Comments: Left upper extremity neurovascularly intact  Psychiatric:        Thought Content: Thought content normal.        Judgment: Judgment normal.     Comments: Mildly anxious mood      UC Treatments / Results  Labs (all labs ordered are listed, but only abnormal results are displayed) Labs Reviewed  POCT URINALYSIS DIP (MANUAL ENTRY) - Abnormal; Notable for the following components:      Result Value   Bilirubin, UA small (*)    Ketones, POC UA large (80) (*)    Spec Grav, UA >=1.030 (*)    Blood, UA moderate (*)    Protein Ur, POC =30 (*)    All other components within normal limits  COMPREHENSIVE METABOLIC PANEL  CBC WITH DIFFERENTIAL/PLATELET  POCT URINE PREGNANCY    EKG   Radiology No results found.  Procedures Procedures (including critical care time)  Medications Ordered in UC Medications  ondansetron (ZOFRAN-ODT) disintegrating tablet 4 mg (4 mg Oral Given 11/09/22 1446)  cefTRIAXone (ROCEPHIN) injection 500 mg (500 mg Intramuscular Given 11/09/22 1445)  Tdap (BOOSTRIX) injection 0.5 mL (0.5 mLs Intramuscular Given 11/09/22 1506)    Initial Impression / Assessment and Plan / UC Course  I have reviewed the triage vital signs and the nursing notes.  Pertinent labs & imaging results that were available during my care of the patient were reviewed by me and considered in my medical decision making (see chart for details).     Patient is significantly tachycardic in triage to 139 bpm, orthostatic vital signs were performed and her heart rate was recorded as high as the 160s standing though her blood pressures have remained not hypotensive.  EKG showing sinus tachycardia at 138 bpm without  acute ST or T wave changes.  Urinalysis showing some ketones, hematuria and urine pregnancy negative.  Unclear why the irregular vaginal bleeding.  She does appear to have a significant cellulitis where her new tattoo was placed on her left forearm, unclear if this is the cause of all of her other generalized symptoms at this time.  Strongly recommended given her unstable heart rate and extent of concerning symptoms that she go to the emergency department for further evaluation.  Initially, she is adamant against this so attempt at lab draw was made but unable to obtain enough sample for both the CBC and CMP.  She was also given a tetanus shot as she did not feel she had had 1 in the last 5 years as well as IM Rocephin, Zofran ODT.  Ultimately upon further discussion after failed lab draw she is agreeable to going to the emergency department for further monitoring and evaluation.  Discussed not driving herself, patient understanding.  Final Clinical Impressions(s) / UC Diagnoses   Final diagnoses:  Tachycardia  Cellulitis of left forearm  Tattoo reaction  Nausea and vomiting, unspecified vomiting type  Vaginal bleeding     Discharge Instructions      I strongly recommend you go to the emergency department at this time due to your concerning symptoms and significantly elevated heart rate.  Because you are opting to go home instead, I strongly recommend having someone drive you and not driving yourself for any reason at this time and having someone stay and supervise  you until you are feeling much better.  Call 911 or have 70 take you to the emergency department immediately if symptoms continue in this manner or worsen at any time.  We have given you a strong shot of antibiotics here as well as some nausea medication and are sending you home on antibiotics and nausea medication as well.  Your labs should be back tomorrow and someone will let you know if these are abnormal.  Make sure to drink plenty of  fluids, including electrolyte solutions.  Again, go directly to the emergency department if worsening or not improving in any way    ED Prescriptions     Medication Sig Dispense Auth. Provider   doxycycline (VIBRAMYCIN) 100 MG capsule Take 1 capsule (100 mg total) by mouth 2 (two) times daily. 20 capsule Volney American, PA-C   ondansetron (ZOFRAN-ODT) 4 MG disintegrating tablet Take 1 tablet (4 mg total) by mouth every 8 (eight) hours as needed for nausea or vomiting. 20 tablet Volney American, [place]      PDMP not reviewed this encounter.   Volney American, [place] 11/09/22 1836

## 2022-11-09 NOTE — ED Notes (Signed)
IV attempted without success, Almyra Free Idol in room at this time

## 2022-11-09 NOTE — ED Triage Notes (Signed)
Pt reports headaches, and dizziness x 1 week. When she stands up she feels lightheaded and feels like all her blood rushes to her feet.   Reports she started some vaginal bleeding  x 1 day. Vomit also had blood in it

## 2022-11-09 NOTE — ED Notes (Signed)
Assisted patient to bathroom.

## 2022-11-09 NOTE — ED Triage Notes (Signed)
Pt in with pain, warmth and redness to L forearm, got a tattoo 2 days ago. HR 140's in triage, temp 100.4. pt also reports emesis x 3 PTA

## 2022-11-09 NOTE — ED Notes (Signed)
Left on hold for 8 mins attempting to give report

## 2022-11-09 NOTE — ED Notes (Signed)
Patient is being discharged from the Urgent Care and sent to the Emergency Department via [place] . Per Valeda Malm., patient is in need of higher level of care due to elevated heart rate, possible infection. Patient is aware and verbalizes understanding of plan of care.  Vitals:   11/09/22 1342  BP: 121/87  Pulse: (!) 139  Resp: 20  Temp: 98.7 F (37.1 C)  SpO2: 98%

## 2022-11-09 NOTE — ED Notes (Signed)
Patient transported to CT 

## 2022-11-09 NOTE — Discharge Instructions (Signed)
I strongly recommend you go to the emergency department at this time due to your concerning symptoms and significantly elevated heart rate.  Because you are opting to go home instead, I strongly recommend having someone drive you and not driving yourself for any reason at this time and having someone stay and supervise you until you are feeling much better.  Call 911 or have 70 take you to the emergency department immediately if symptoms continue in this manner or worsen at any time.  We have given you a strong shot of antibiotics here as well as some nausea medication and are sending you home on antibiotics and nausea medication as well.  Your labs should be back tomorrow and someone will let you know if these are abnormal.  Make sure to drink plenty of fluids, including electrolyte solutions.  Again, go directly to the emergency department if worsening or not improving in any way

## 2022-11-09 NOTE — ED Provider Notes (Signed)
[facility] Provider Note   CSN: [id number] Arrival date & time: 11/09/22  1527     History  Chief Complaint  Patient presents with   Tachycardia   Tattoo Infection    Tabitha Fitzpatrick is a 22 y.o. female with a history significant for POTS syndrome presenting for evaluation of increased pain, redness and swelling at her left forearm after obtaining a tattoo late last week.  She describes severe pain with any movement or palpation of her left forearm.  She also reports having low-grade fevers, has developed some mild nausea today without emesis.  She does endorse having subtle headache and feeling lightheadedness with positional changes for the past week.  With her POTS syndrome she typically has a heart rate around 110 at baseline,  noted to be as high as 140 prior to arrival today.  She denies chest pain, shortness of breath, diaphoresis.  She was seen at a urgent care center prior to arriving here where she received IM dose of Rocephin, she also had her tetanus updated.     The history is provided by the patient.       Home Medications Prior to Admission medications   Medication Sig Start Date End Date Taking? Authorizing Provider  albuterol (VENTOLIN HFA) 108 (90 Base) MCG/ACT inhaler Inhale 2 puffs into the lungs every 6 (six) hours as needed for wheezing or shortness of breath. 09/02/22   Leath-Warren, Alda Lea, NP  doxycycline (VIBRAMYCIN) 100 MG capsule Take 1 capsule (100 mg total) by mouth 2 (two) times daily. 11/09/22   Volney American, PA-C  hydrOXYzine (ATARAX/VISTARIL) 10 MG tablet Take 1 tablet (10 mg total) by mouth 3 (three) times daily as needed for anxiety. 08/14/21   Cloria Spring, MD  lidocaine (XYLOCAINE) 2 % solution Use as directed 10 mLs in the mouth or throat every 6 (six) hours as needed for mouth pain. 09/02/22   Leath-Warren, Alda Lea, NP  ondansetron (ZOFRAN-ODT) 4 MG disintegrating tablet Take 1  tablet (4 mg total) by mouth every 8 (eight) hours as needed for nausea or vomiting. 11/09/22   Volney American, PA-C  prazosin (MINIPRESS) 5 MG capsule Take 1 capsule (5 mg total) by mouth at bedtime. 08/14/21   Cloria Spring, MD  sertraline (ZOLOFT) 50 MG tablet Take 1.5 tablets (75 mg total) by mouth daily. 08/14/21 08/14/22  Cloria Spring, MD      Allergies    Patient has no active allergies.    Review of Systems   Review of Systems  Constitutional:  Positive for fever.  HENT:  Negative for congestion and sore throat.   Eyes: Negative.   Respiratory:  Negative for chest tightness and shortness of breath.   Cardiovascular:  Negative for chest pain.  Gastrointestinal:  Positive for nausea. Negative for abdominal pain.  Genitourinary: Negative.   Musculoskeletal:  Positive for arthralgias. Negative for joint swelling and neck pain.  Skin:  Positive for color change. Negative for rash and wound.  Neurological:  Negative for dizziness, weakness, light-headedness, numbness and headaches.  Psychiatric/Behavioral: Negative.    All other systems reviewed and are negative.   Physical Exam Updated Vital Signs BP 106/63 (BP Location: Right Arm)   Pulse (!) 118   Temp 99.3 F (37.4 C) (Oral)   Resp 17   Ht 5\' 5"  (1.651 m)   Wt 72.6 kg   LMP 10/28/2022   SpO2 100%   BMI 26.63 kg/m  Physical Exam Vitals and nursing note reviewed.  Constitutional:      Appearance: She is well-developed.  HENT:     Head: Normocephalic and atraumatic.  Eyes:     Conjunctiva/sclera: Conjunctivae normal.  Cardiovascular:     Rate and Rhythm: Normal rate and regular rhythm.     Heart sounds: Normal heart sounds.  Pulmonary:     Effort: Pulmonary effort is normal.     Breath sounds: Normal breath sounds. No wheezing.  Abdominal:     General: Bowel sounds are normal.     Palpations: Abdomen is soft.     Tenderness: There is no abdominal tenderness.  Musculoskeletal:        General:  Normal range of motion.     Cervical back: Normal range of motion.  Lymphadenopathy:     Upper Body:     Left upper body: Axillary adenopathy present.  Skin:    General: Skin is warm and dry.     Findings: Erythema present.     Comments: Significant erythema and edema of left volar forearm.  No red streaking.   Neurological:     Mental Status: She is alert.     ED Results / Procedures / Treatments   Labs (all labs ordered are listed, but only abnormal results are displayed) Labs Reviewed  COMPREHENSIVE METABOLIC PANEL - Abnormal; Notable for the following components:      Result Value   CO2 19 (*)    Calcium 8.7 (*)    Total Bilirubin 2.0 (*)    All other components within normal limits  CBC WITH DIFFERENTIAL/PLATELET - Abnormal; Notable for the following components:   WBC 10.9 (*)    MCV 101.7 (*)    Neutro Abs 9.6 (*)    Lymphs Abs 0.6 (*)    All other components within normal limits  URINALYSIS, W/ REFLEX TO CULTURE (INFECTION SUSPECTED) - Abnormal; Notable for the following components:   Color, Urine AMBER (*)    APPearance HAZY (*)    Hgb urine dipstick LARGE (*)    Ketones, ur 80 (*)    Protein, ur 100 (*)    Bacteria, UA RARE (*)    All other components within normal limits  RESP PANEL BY RT-PCR (RSV, FLU A&B, COVID)  RVPGX2  CULTURE, BLOOD (ROUTINE X 2)  CULTURE, BLOOD (ROUTINE X 2)  URINE CULTURE  LACTIC ACID, PLASMA  LACTIC ACID, PLASMA  PROTIME-INR  APTT  POC URINE PREG, ED    EKG EKG Interpretation  Date/Time:  Monday November 09 2022 16:54:45 EST Ventricular Rate:  124 PR Interval:  134 QRS Duration: 93 QT Interval:  290 QTC Calculation: 417 R Axis:   89 Text Interpretation: Sinus tachycardia RSR' in V1 or V2, right VCD or RVH No old tracing to compare Confirmed by Isla Pence [phone]) on 11/09/2022 5:02:58 PM  Radiology DG Chest Port 1 View  Result Date: 11/09/2022 CLINICAL DATA:  Questionable sepsis EXAM: PORTABLE CHEST 1 VIEW COMPARISON:   Chest x-ray June 05, 2016 FINDINGS: The heart size and mediastinal contours are within normal limits. Both lungs are clear. The visualized skeletal structures are unremarkable. IMPRESSION: No active disease. Electronically Signed   By: Dorise Bullion III M.D.   On: 11/09/2022 17:37    Procedures Procedures    Medications Ordered in ED Medications  lactated ringers infusion ( Intravenous New Bag/Given 11/09/22 1933)  lactated ringers bolus 1,000 mL (0 mLs Intravenous Stopped 11/09/22 1807)    And  lactated  ringers bolus 1,000 mL (0 mLs Intravenous Stopped 11/09/22 1932)    And  lactated ringers bolus 250 mL (has no administration in time range)  ceFEPIme (MAXIPIME) 2 g in sodium chloride 0.9 % 100 mL IVPB (2 g Intravenous New Bag/Given 11/09/22 1946)  metroNIDAZOLE (FLAGYL) IVPB 500 mg (0 mg Intravenous Stopped 11/09/22 1807)  vancomycin (VANCOCIN) IVPB 1000 mg/200 mL premix (0 mg Intravenous Stopped 11/09/22 1933)  ondansetron (ZOFRAN) injection 4 mg (4 mg Intravenous Given 11/09/22 1824)  ketorolac (TORADOL) 30 MG/ML injection 15 mg (15 mg Intravenous Given 11/09/22 1835)  fentaNYL (SUBLIMAZE) injection 50 mcg (50 mcg Intravenous Given 11/09/22 1950)    ED Course/ Medical Decision Making/ A&P                             Medical Decision Making Pt presenting with cellulitis associated with recent tattoo of left forearm,  significant tachycardia, presenting with low grade fever to 100.4,  significant pain left forearm.  No red streaking to upper arm, but expanding tenderness to the upper arm.    She has received IV fluids here,  given fever and tachycardia,  sepsis protocol instituted,  lactates are normal.  IV abx including flagyl, vanc and cefepime, protocol for suspected cellulitis sepsis.  During course of ed stay, erythema has expanded a bit further, now starting to involve dorsolateral forearm as well. I think this patient would be best served with on overnight obs admission to ensure  cellulitis is receding prior to dispo home.   Amount and/or Complexity of Data Reviewed Labs: ordered.    Details: Leukocytosis 10.9 Radiology: ordered. ECG/medicine tests: ordered. Discussion of management or test interpretation with external provider(s): Pt discussed with Dr. Clearence Ped who accepts pt for admission.  Risk Prescription drug management. Decision regarding hospitalization.           Final Clinical Impression(s) / ED Diagnoses Final diagnoses:  Cellulitis of left upper extremity  Leukocytosis, unspecified type    Rx / DC Orders ED Discharge Orders     None         Landis Martins 11/09/22 2104    Isla Pence, MD 11/09/22 2226

## 2022-11-10 DIAGNOSIS — Z8249 Family history of ischemic heart disease and other diseases of the circulatory system: Secondary | ICD-10-CM | POA: Diagnosis not present

## 2022-11-10 DIAGNOSIS — R6510 Systemic inflammatory response syndrome (SIRS) of non-infectious origin without acute organ dysfunction: Secondary | ICD-10-CM | POA: Diagnosis not present

## 2022-11-10 DIAGNOSIS — F4310 Post-traumatic stress disorder, unspecified: Secondary | ICD-10-CM | POA: Diagnosis present

## 2022-11-10 DIAGNOSIS — L818 Other specified disorders of pigmentation: Secondary | ICD-10-CM | POA: Diagnosis present

## 2022-11-10 DIAGNOSIS — Z818 Family history of other mental and behavioral disorders: Secondary | ICD-10-CM | POA: Diagnosis not present

## 2022-11-10 DIAGNOSIS — R000 Tachycardia, unspecified: Secondary | ICD-10-CM | POA: Diagnosis not present

## 2022-11-10 DIAGNOSIS — J45909 Unspecified asthma, uncomplicated: Secondary | ICD-10-CM | POA: Diagnosis present

## 2022-11-10 DIAGNOSIS — Z833 Family history of diabetes mellitus: Secondary | ICD-10-CM | POA: Diagnosis not present

## 2022-11-10 DIAGNOSIS — L03114 Cellulitis of left upper limb: Secondary | ICD-10-CM

## 2022-11-10 DIAGNOSIS — Z28310 Unvaccinated for covid-19: Secondary | ICD-10-CM | POA: Diagnosis not present

## 2022-11-10 DIAGNOSIS — D649 Anemia, unspecified: Secondary | ICD-10-CM | POA: Diagnosis present

## 2022-11-10 DIAGNOSIS — Z1152 Encounter for screening for COVID-19: Secondary | ICD-10-CM | POA: Diagnosis not present

## 2022-11-10 DIAGNOSIS — Z8049 Family history of malignant neoplasm of other genital organs: Secondary | ICD-10-CM | POA: Diagnosis not present

## 2022-11-10 DIAGNOSIS — L0390 Cellulitis, unspecified: Secondary | ICD-10-CM | POA: Diagnosis present

## 2022-11-10 DIAGNOSIS — A419 Sepsis, unspecified organism: Secondary | ICD-10-CM | POA: Diagnosis present

## 2022-11-10 DIAGNOSIS — G90A Postural orthostatic tachycardia syndrome (POTS): Secondary | ICD-10-CM | POA: Diagnosis present

## 2022-11-10 LAB — URINE CULTURE: Culture: NO GROWTH

## 2022-11-10 LAB — COMPREHENSIVE METABOLIC PANEL
ALT: 12 U/L (ref 0–44)
AST: 13 U/L — ABNORMAL LOW (ref 15–41)
Albumin: 3.3 g/dL — ABNORMAL LOW (ref 3.5–5.0)
Alkaline Phosphatase: 36 U/L — ABNORMAL LOW (ref 38–126)
Anion gap: 7 (ref 5–15)
BUN: 11 mg/dL (ref 6–20)
CO2: 23 mmol/L (ref 22–32)
Calcium: 8.1 mg/dL — ABNORMAL LOW (ref 8.9–10.3)
Chloride: 105 mmol/L (ref 98–111)
Creatinine, Ser: 0.76 mg/dL (ref 0.44–1.00)
GFR, Estimated: >60 mL/min (ref >60)
Glucose, Bld: 103 mg/dL — ABNORMAL HIGH (ref 70–99)
Potassium: 3.7 mmol/L (ref 3.5–5.1)
Sodium: 135 mmol/L (ref 135–145)
Total Bilirubin: 1.3 mg/dL — ABNORMAL HIGH (ref 0.3–1.2)
Total Protein: 5.8 g/dL — ABNORMAL LOW (ref 6.5–8.1)

## 2022-11-10 LAB — CBC WITH DIFFERENTIAL/PLATELET
Abs Immature Granulocytes: 0.03 10*3/uL (ref 0.00–0.07)
Basophils Absolute: 0 10*3/uL (ref 0.0–0.1)
Basophils Relative: 0 %
Eosinophils Absolute: 0 10*3/uL (ref 0.0–0.5)
Eosinophils Relative: 0 %
HCT: 35.9 % — ABNORMAL LOW (ref 36.0–46.0)
Hemoglobin: 11.8 g/dL — ABNORMAL LOW (ref 12.0–15.0)
Immature Granulocytes: 0 %
Lymphocytes Relative: 14 %
Lymphs Abs: 1 10*3/uL (ref 0.7–4.0)
MCH: 32.6 pg (ref 26.0–34.0)
MCHC: 32.9 g/dL (ref 30.0–36.0)
MCV: 99.2 fL (ref 80.0–100.0)
Monocytes Absolute: 0.6 10*3/uL (ref 0.1–1.0)
Monocytes Relative: 8 %
Neutro Abs: 5.7 10*3/uL (ref 1.7–7.7)
Neutrophils Relative %: 78 %
Platelets: 175 10*3/uL (ref 150–400)
RBC: 3.62 MIL/uL — ABNORMAL LOW (ref 3.87–5.11)
RDW: 12 % (ref 11.5–15.5)
WBC: 7.3 10*3/uL (ref 4.0–10.5)
nRBC: 0 % (ref 0.0–0.2)

## 2022-11-10 LAB — HIV ANTIBODY (ROUTINE TESTING W REFLEX): HIV Screen 4th Generation wRfx: NONREACTIVE

## 2022-11-10 LAB — MAGNESIUM: Magnesium: 2.1 mg/dL (ref 1.7–2.4)

## 2022-11-10 MED ORDER — IBUPROFEN 600 MG PO TABS
600.0000 mg | ORAL_TABLET | Freq: Three times a day (TID) | ORAL | Status: AC
  Administered 2022-11-10 – 2022-11-11 (×5): 600 mg via ORAL
  Filled 2022-11-10 (×5): qty 1

## 2022-11-10 MED ORDER — MORPHINE SULFATE (PF) 2 MG/ML IV SOLN
2.0000 mg | Freq: Four times a day (QID) | INTRAVENOUS | Status: DC | PRN
  Administered 2022-11-11: 2 mg via INTRAVENOUS
  Filled 2022-11-10: qty 1

## 2022-11-10 NOTE — Progress Notes (Signed)
Patient seen and examined; admitted after midnight secondary to left forearm cellulitic process.  Patient met SIRS criteria at time of admission, she did not rule in for sepsis.  So far adequately responding to IV fluids and antibiotics.  Please refer to H&P written on 11/10/2022 by Dr.Zierle-Ghosh for info/details on admission.  Plan: -Continue fluid resuscitation -Scheduled dosages of ibuprofen to assist with inflammatory changes and pain. -Follow culture results and come continue IV antibiotics -Leg elevation as much as possible and the use of cold compresses. -Follow clinical response. -CT scan review and no abscess or deep tissue/structures compromise.   Barton Dubois MD [phone]

## 2022-11-10 NOTE — TOC Progression Note (Signed)
Transition of Care [facility]) - Progression Note    Patient Details  Name: Tabitha Fitzpatrick MRN: [id number] Date of Birth: 12/18/2000  Transition of [facility]) CM/SW Contact  Salome Arnt, [place] Phone Number: 11/10/2022, 10:23 AM  Clinical Narrative:  Transition of Care (TOC) Screening Note   Patient Details  Name: Tabitha Fitzpatrick Date of Birth: August 21, 2001   Transition of Care [facility]) CM/SW Contact:    Salome Arnt, [place] Phone Number: 11/10/2022, 10:23 AM    Transition of Care Department [facility]) has reviewed patient and no TOC needs have been identified at this time. We will continue to monitor patient advancement through interdisciplinary progression rounds. If new patient transition needs arise, please place a TOC consult.             Expected Discharge Plan and Services                                               Social Determinants of Health (SDOH) Interventions SDOH Screenings   Depression (PHQ2-9): High Risk (01/12/2022)  Tobacco Use: Low Risk  (11/09/2022)    Readmission Risk Interventions     No data to display

## 2022-11-10 NOTE — Assessment & Plan Note (Signed)
-   Reports history of POTS - Reports heart rate is usually 100-110 - Heart rate has been elevated during his hospitalization but has been better with fluids - Continue fluids anticipate seeing continued improvement - Control pain - Continue to monitor

## 2022-11-10 NOTE — Assessment & Plan Note (Signed)
-   Erythema, pain, edema at site of new tattoo on left forearm - CT forearm shows cellulitis with no rim-enhancing fluid collection to suggest abscess - Started on vancomycin, cefepime, Flagyl in the ED - Continue Rocephin - Blood cultures pending - Continue to monitor

## 2022-11-10 NOTE — H&P (Signed)
History and Physical    Patient: Tabitha Fitzpatrick IRJ:[id number] DOB: 08/23/2001 DOA: 11/09/2022 DOS: the patient was seen and examined on 11/10/2022 PCP: System, Provider Not In  Patient coming from: Home  Chief Complaint:  Chief Complaint  Patient presents with   Tachycardia   Tattoo Infection   HPI: Tabitha Fitzpatrick is a 22 y.o. female with medical history significant of anxiety, asthma, depression, PTSD, POTS presents ED with a chief complaint of arm pain.  Patient recently had a tattoo 3 days ago.  That night she noticed she had increased pain.  She describes the pain as feeling like a bad sunburn.  It has been progressively worse since then.  She wrapped it with Saran wrap and Aquaphor.  She ran cold water over the Saran wrap when it felt stinging and burning.  She reports has been taking ibuprofen around-the-clock.  She has felt feverish but has not had a measured temp.  She had nausea and vomiting x 2 with pink tinge.  She denies belly pain.  She denies urinary symptoms.  Patient reports that before coming to the ER she did go to urgent care where they prescribed doxycycline, but she has not started that prescription yet.  Patient has no other complaints at this time.  Patient has no allergies to antibiotics.  Patient does not smoke, does not drink, does not use illicit drugs.  She is not vaccinated for COVID.  Patient is full code. Review of Systems: As mentioned in the history of present illness. All other systems reviewed and are negative. Past Medical History:  Diagnosis Date   Anxiety    Asthma    mild, no inhalers at home   Depression    PTSD (post-traumatic stress disorder)    Past Surgical History:  Procedure Laterality Date   ADENOIDECTOMY     age 50 years   TONSILLECTOMY     age 30 years   Social History:  reports that she has never smoked. She has never used smokeless tobacco. She reports that she does not drink alcohol and does not use drugs.  No Active  Allergies  Family History  Problem Relation Age of Onset   Cancer Mother        cervical cancer - remission now, hysterectomy   Anxiety disorder Mother    Hypertension Father        father also has reflux   Diabetes Maternal Grandmother    Hypertension Maternal Grandmother    Diabetes Paternal Grandmother    Hypertension Paternal Grandmother     Prior to Admission medications   Medication Sig Start Date End Date Taking? Authorizing Provider  acetaminophen (TYLENOL) 325 MG tablet Take 650 mg by mouth every 6 (six) hours as needed.   Yes [provider]  ibuprofen (ADVIL) 200 MG tablet Take 600-800 mg by mouth every 6 (six) hours as needed for moderate pain.   Yes [provider]  ondansetron (ZOFRAN-ODT) 4 MG disintegrating tablet Take 1 tablet (4 mg total) by mouth every 8 (eight) hours as needed for nausea or vomiting. 11/09/22  Yes Volney American, PA-C  albuterol (VENTOLIN HFA) 108 (90 Base) MCG/ACT inhaler Inhale 2 puffs into the lungs every 6 (six) hours as needed for wheezing or shortness of breath. Patient not taking: Reported on 11/09/2022 09/02/22   Leath-Warren, Alda Lea, NP  doxycycline (VIBRAMYCIN) 100 MG capsule Take 1 capsule (100 mg total) by mouth 2 (two) times daily. Patient not taking: Reported on 11/09/2022 11/09/22  Volney American, PA-C  lidocaine (XYLOCAINE) 2 % solution Use as directed 10 mLs in the mouth or throat every 6 (six) hours as needed for mouth pain. Patient not taking: Reported on 11/09/2022 09/02/22   Leath-Warren, Alda Lea, NP  sertraline (ZOLOFT) 50 MG tablet Take 1.5 tablets (75 mg total) by mouth daily. Patient not taking: Reported on 11/09/2022 08/14/21 08/14/22  Cloria Spring, MD    Physical Exam: Vitals:   11/09/22 2138 11/09/22 2241 11/09/22 2241 11/10/22 0110  BP: 104/67  113/72 110/70  Pulse: (!) 110  (!) 129 (!) 103  Resp: 20  20 20   Temp: 99.7 F (37.6 C)  98.4 F (36.9 C) 98.3 F (36.8 C)  TempSrc: Oral    Oral  SpO2: 100%  100% 100%  Weight:  71.6 kg    Height:  5\' 5"  (1.651 m)     1.  General: Patient lying supine in bed,  no acute distress   2. Psychiatric: Alert and oriented x 3, mood and behavior normal for situation, pleasant and cooperative with exam   3. Neurologic: Speech and language are normal, face is symmetric, moves all 4 extremities voluntarily, at baseline without acute deficits on limited exam   4. HEENMT:  Head is atraumatic, normocephalic, pupils reactive to light, neck is supple, trachea is midline, mucous membranes are moist   5. Respiratory : Lungs are clear to auscultation bilaterally without wheezing, rhonchi, rales, no cyanosis, no increase in work of breathing or accessory muscle use   6. Cardiovascular : Heart rate tachycardic, rhythm is regular, no murmurs, rubs or gallops, no peripheral edema, peripheral pulses palpated   7. Gastrointestinal:  Abdomen is soft, nondistended, nontender to palpation bowel sounds active, no masses or organomegaly palpated   8. Skin:  Left forearm is erythematous   9.Musculoskeletal:  Left forearm is erythematous, edematous, limited active range of motion of the wrist due to pain, passive range of motion of the wrist is intact  Data Reviewed: In the ED Temp 98.7-100.4, heart rate 118-145, respiratory rate 17-23, blood pressure 106/63-131/87, satting 98-100% UA was suspicious for UTI but patient is not having any urinary symptoms No leukocytosis with white blood cell count 10.9, hemoglobin 13.6, platelets 168 Chemistries unremarkable Negative COVID and flu Patient was given cefepime, Flagyl, vancomycin Patient was given fentanyl, Toradol, Zofran for symptom control 2.25 L bolus of LR LR continued at 150 mL/h Admission requested due to persistent tachycardia and further management of cellulitis  Assessment and Plan: * Cellulitis - Erythema, pain, edema at site of new tattoo on left forearm - CT forearm shows  cellulitis with no rim-enhancing fluid collection to suggest abscess - Started on vancomycin, cefepime, Flagyl in the ED - Continue Rocephin - Blood cultures pending - Continue to monitor  SIRS (systemic inflammatory response syndrome) (HCC) - Temp 100.4, heart rate 118-145, respiratory rate 23 - No leukocytosis, blood pressure stable - Source is most likely cellulitis - Cefepime, vancomycin, Flagyl started in the ED - Patient given 2.25 L bolus - Continue LR 150 MLS per hour - Continue Rocephin - No evidence of endorgan damage at this time  Tachycardia - Reports history of POTS - Reports heart rate is usually 100-110 - Heart rate has been elevated during his hospitalization but has been better with fluids - Continue fluids anticipate seeing continued improvement - Control pain - Continue to monitor      Advance Care Planning:   Code Status: Full Code  Consults: None this  time  Family Communication: No family at bedside  Severity of Illness: The appropriate patient status for this patient is OBSERVATION. Observation status is judged to be reasonable and necessary in order to provide the required intensity of service to ensure the patient's safety. The patient's presenting symptoms, physical exam findings, and initial radiographic and laboratory data in the context of their medical condition is felt to place them at decreased risk for further clinical deterioration. Furthermore, it is anticipated that the patient will be medically stable for discharge from the hospital within 2 midnights of admission.   Author: Rolla Plate, DO 11/10/2022 4:35 AM  For on call review [email or web address].

## 2022-11-10 NOTE — Assessment & Plan Note (Signed)
-   Temp 100.4, heart rate 118-145, respiratory rate 23 - No leukocytosis, blood pressure stable - Source is most likely cellulitis - Cefepime, vancomycin, Flagyl started in the ED - Patient given 2.25 L bolus - Continue LR 150 MLS per hour - Continue Rocephin - No evidence of endorgan damage at this time

## 2022-11-11 DIAGNOSIS — A419 Sepsis, unspecified organism: Secondary | ICD-10-CM | POA: Diagnosis present

## 2022-11-11 DIAGNOSIS — L03114 Cellulitis of left upper limb: Secondary | ICD-10-CM | POA: Diagnosis not present

## 2022-11-11 DIAGNOSIS — R000 Tachycardia, unspecified: Secondary | ICD-10-CM | POA: Diagnosis not present

## 2022-11-11 LAB — CBC WITH DIFFERENTIAL/PLATELET
Basophils Absolute: 0 10*3/uL (ref 0.0–0.2)
Basos: 0 %
EOS (ABSOLUTE): 0 10*3/uL (ref 0.0–0.4)
Eos: 0 %
Hematocrit: 40.3 % (ref 34.0–46.6)
Hemoglobin: 13.9 g/dL (ref 11.1–15.9)
Immature Grans (Abs): 0 10*3/uL (ref 0.0–0.1)
Immature Granulocytes: 0 %
Lymphocytes Absolute: 0.6 10*3/uL — ABNORMAL LOW (ref 0.7–3.1)
Lymphs: 5 %
MCH: 32.6 pg (ref 26.6–33.0)
MCHC: 34.5 g/dL (ref 31.5–35.7)
MCV: 95 fL (ref 79–97)
Monocytes Absolute: 0.8 10*3/uL (ref 0.1–0.9)
Monocytes: 7 %
Neutrophils Absolute: 9.7 10*3/uL — ABNORMAL HIGH (ref 1.4–7.0)
Neutrophils: 88 %
Platelets: 188 10*3/uL (ref 150–450)
RBC: 4.26 x10E6/uL (ref 3.77–5.28)
RDW: 12.1 % (ref 11.7–15.4)
WBC: 11.1 10*3/uL — ABNORMAL HIGH (ref 3.4–10.8)

## 2022-11-11 LAB — BASIC METABOLIC PANEL
Anion gap: 9 (ref 5–15)
BUN: 12 mg/dL (ref 6–20)
CO2: 21 mmol/L — ABNORMAL LOW (ref 22–32)
Calcium: 8 mg/dL — ABNORMAL LOW (ref 8.9–10.3)
Chloride: 108 mmol/L (ref 98–111)
Creatinine, Ser: 0.69 mg/dL (ref 0.44–1.00)
GFR, Estimated: >60 mL/min (ref >60)
Glucose, Bld: 104 mg/dL — ABNORMAL HIGH (ref 70–99)
Potassium: 4.2 mmol/L (ref 3.5–5.1)
Sodium: 138 mmol/L (ref 135–145)

## 2022-11-11 LAB — COMPREHENSIVE METABOLIC PANEL

## 2022-11-11 LAB — CBC
HCT: 34.4 % — ABNORMAL LOW (ref 36.0–46.0)
Hemoglobin: 11.2 g/dL — ABNORMAL LOW (ref 12.0–15.0)
MCH: 32.7 pg (ref 26.0–34.0)
MCHC: 32.6 g/dL (ref 30.0–36.0)
MCV: 100.6 fL — ABNORMAL HIGH (ref 80.0–100.0)
Platelets: 146 10*3/uL — ABNORMAL LOW (ref 150–400)
RBC: 3.42 MIL/uL — ABNORMAL LOW (ref 3.87–5.11)
RDW: 12.4 % (ref 11.5–15.5)
WBC: 5 10*3/uL (ref 4.0–10.5)
nRBC: 0 % (ref 0.0–0.2)

## 2022-11-11 LAB — URINALYSIS, ROUTINE W REFLEX MICROSCOPIC
Bilirubin Urine: NEGATIVE
Glucose, UA: NEGATIVE mg/dL
Hgb urine dipstick: NEGATIVE
Ketones, ur: NEGATIVE mg/dL
Leukocytes,Ua: NEGATIVE
Nitrite: NEGATIVE
Protein, ur: NEGATIVE mg/dL
Specific Gravity, Urine: 1.015 (ref 1.005–1.030)
pH: 7 (ref 5.0–8.0)

## 2022-11-11 MED ORDER — PROCHLORPERAZINE 25 MG RE SUPP: 25.0000 mg | Freq: Two times a day (BID) | RECTAL | Status: DC | PRN

## 2022-11-11 MED ORDER — DOXYCYCLINE HYCLATE 100 MG PO TABS
100.0000 mg | ORAL_TABLET | Freq: Two times a day (BID) | ORAL | Status: DC
  Administered 2022-11-11 – 2022-11-12 (×3): 100 mg via ORAL
  Filled 2022-11-11 (×3): qty 1

## 2022-11-11 MED ORDER — METOCLOPRAMIDE HCL 5 MG/ML IJ SOLN
10.0000 mg | Freq: Once | INTRAMUSCULAR | Status: AC
  Administered 2022-11-11: 10 mg via INTRAVENOUS
  Filled 2022-11-11: qty 2

## 2022-11-11 MED ORDER — SENNOSIDES-DOCUSATE SODIUM 8.6-50 MG PO TABS
2.0000 | ORAL_TABLET | Freq: Every day | ORAL | Status: DC
  Administered 2022-11-11: 2 via ORAL
  Filled 2022-11-11: qty 2

## 2022-11-11 NOTE — Progress Notes (Signed)
Patient c/o feeling dizzy VS obtained and stable , notified Dr. Joesph Fillers Given PRN zofran for nausea ,     11/11/22 0947  Vitals  Temp 97.8 F (36.6 C)  BP 111/67  MAP (mmHg) 80  BP Location Right Arm  Patient Position (if appropriate) Sitting  Pulse Rate 99  Resp 20  Level of Consciousness  Level of Consciousness Alert  MEWS COLOR  MEWS Score Color Green  Oxygen Therapy  SpO2 100 %  MEWS Score  MEWS Temp 0  MEWS Systolic 0  MEWS Pulse 0  MEWS RR 0  MEWS LOC 0  MEWS Score 0

## 2022-11-11 NOTE — Progress Notes (Signed)
PROGRESS NOTE     Tabitha Fitzpatrick, is a 22 y.o. female, DOB - 08-21-2001, KDX:[id number]  Admit date - 11/09/2022   Admitting Physician Barton Dubois, MD  Outpatient Primary MD for the patient is System, Provider Not In  LOS - 1  Chief Complaint  Patient presents with   Tachycardia   Tattoo Infection        Brief Narrative:  22 y.o. female with medical history significant of anxiety, asthma, depression, PTSD, POTS admitted on 11/10/2022 with left arm cellulitis--- sxs started on 11/08/2022 for after getting tattoo on same arm  on 11/07/2022    -Assessment and Plan: 1) sepsis secondary to left arm Cellulitis--POA - -at site of new tattoo on left forearm (sxs started on 11/08/2022 for after getting tattoo on same arm  on 11/07/2022) - CT forearm shows cellulitis with no rim-enhancing fluid collection to suggest abscess -Initially received  vancomycin, cefepime, Flagyl in the ED - Continue Rocephin -Add doxycycline - Blood and urine cultures NGTD -Continue IV fluids as needed opiates  2)Tachycardia - Reports history of POTS - Reports heart rate is usually 100-110 - Heart rate has been elevated during his hospitalization but has been better with fluids - Continue fluids anticipate seeing continued improvement - Control pain  3)Nausea---?? Opiate related -Prn antiemetics and IV fluids until her intake is more reliable  4)Possible UTI----Complains of urinary symptoms including dysuria and urinary frequency -Urine culture pending -Continue IV Rocephin as above #1  5) acute anemia--- suspect hemodilution related - no bleeding concerns  -- continue to monitor closely   Status is: Inpatient   Disposition: The patient is from: Home              Anticipated d/c is to: Home              Anticipated d/c date is: 1 day              Patient currently is not medically stable to d/c. Barriers: Not Clinically Stable-   Code Status :  -  Code Status: Full Code   Family Communication:     NA (patient is alert, awake and coherent)   DVT Prophylaxis  :   - SCDs  heparin injection 5,000 Units Start: 11/09/22 2330 SCDs Start: 11/09/22 2237   Lab Results  Component Value Date   PLT 146 (L) 11/11/2022    Inpatient Medications  Scheduled Meds:  doxycycline  100 mg Oral Q12H   heparin  5,000 Units Subcutaneous Q8H   ibuprofen  600 mg Oral TID   influenza vac split quadrivalent PF  0.5 mL Intramuscular Tomorrow-1000   Continuous Infusions:  cefTRIAXone (ROCEPHIN)  IV 1 g (11/11/22 0322)   lactated ringers     PRN Meds:.acetaminophen **OR** acetaminophen, morphine injection, ondansetron **OR** ondansetron (ZOFRAN) IV, oxyCODONE, polyethylene glycol   Anti-infectives (From admission, onward)    Start     Dose/Rate Route Frequency Ordered Stop   11/11/22 1000  doxycycline (VIBRA-TABS) tablet 100 mg        100 mg Oral Every 12 hours 11/11/22 0853     11/10/22 0400  cefTRIAXone (ROCEPHIN) 1 g in sodium chloride 0.9 % 100 mL IVPB        1 g 200 mL/hr over 30 Minutes Intravenous Every 24 hours 11/09/22 2236 11/17/22 0359   11/09/22 1630  ceFEPIme (MAXIPIME) 2 g in sodium chloride 0.9 % 100 mL IVPB        2 g 200 mL/hr over 30  Minutes Intravenous  Once 11/09/22 1624 11/09/22 2022   11/09/22 1630  metroNIDAZOLE (FLAGYL) IVPB 500 mg        500 mg 100 mL/hr over 60 Minutes Intravenous  Once 11/09/22 1624 11/09/22 1807   11/09/22 1630  vancomycin (VANCOCIN) IVPB 1000 mg/200 mL premix        1,000 mg 200 mL/hr over 60 Minutes Intravenous  Once 11/09/22 1624 11/09/22 1933       Subjective: Tabitha Fitzpatrick today has no fevers, no emesis,  No chest pain,   -Left arm swelling ,redness and pain is not worse -Complains of urinary symptoms including dysuria and urinary frequency   Objective: Vitals:   11/10/22 1924 11/10/22 2143 11/11/22 0410 11/11/22 0947  BP: 103/74 106/65 (!) 90/51 111/67  Pulse: 86 87 72 99  Resp: 18 20 18 20   Temp: 98.3 F (36.8 C) 98.2 F (36.8 C)  98.6 F (37 C) 97.8 F (36.6 C)  TempSrc: Oral     SpO2: 100% 100% 100% 100%  Weight:      Height:        Intake/Output Summary (Last 24 hours) at 11/11/2022 1017 Last data filed at 11/11/2022 0500 Gross per 24 hour  Intake 240 ml  Output --  Net 240 ml   Filed Weights   11/09/22 1548 11/09/22 1853 11/09/22 2241  Weight: 72.6 kg 72.6 kg 71.6 kg    Physical Exam  Gen:- Awake Alert,  in no apparent distress  HEENT:- [place].AT, No sclera icterus Neck-Supple Neck,No JVD,.  Lungs-  CTAB , fair symmetrical air movement CV- S1, S2 normal, regular  Abd-  +ve B.Sounds, Abd Soft, No tenderness,    Extremity/Skin:-   pedal pulses present , left warmth, tenderness, swelling and redness is not worse, no streaking, no open draining areas, tattoos noted Psych-affect is appropriate, oriented x3 Neuro-no new focal deficits, no tremors  Data Reviewed: I have personally reviewed following labs and imaging studies  CBC: Recent Labs  Lab 11/09/22 1646 11/10/22 0411 11/11/22 0446  WBC 10.9* 7.3 5.0  NEUTROABS 9.6* 5.7  --   HGB 13.6 11.8* 11.2*  HCT 41.8 35.9* 34.4*  MCV 101.7* 99.2 100.6*  PLT 168 175 703*   Basic Metabolic Panel: Recent Labs  Lab 11/09/22 1646 11/10/22 0411 11/11/22 0446  NA 135 135 138  K 3.6 3.7 4.2  CL 103 105 108  CO2 19* 23 21*  GLUCOSE 82 103* 104*  BUN 15 11 12   CREATININE 0.77 0.76 0.69  CALCIUM 8.7* 8.1* 8.0*  MG  --  2.1  --    GFR: Estimated Creatinine Clearance: 112.1 mL/min (by C-G formula based on SCr of 0.69 mg/dL). Liver Function Tests: Recent Labs  Lab 11/09/22 1646 11/10/22 0411  AST 17 13*  ALT 15 12  ALKPHOS 44 36*  BILITOT 2.0* 1.3*  PROT 7.5 5.8*  ALBUMIN 4.3 3.3*   Cardiac Enzymes: No results for input(s): "CKTOTAL", "CKMB", "CKMBINDEX", "TROPONINI" in the last 168 hours. BNP (last 3 results) No results for input(s): "PROBNP" in the last 8760 hours. HbA1C: No results for input(s): "HGBA1C" in the last 72 hours. Sepsis  Labs: @LABRCNTIP (procalcitonin:4,lacticidven:4) ) Recent Results (from the past 240 hour(s))  Blood Culture (routine x 2)     Status: None (Preliminary result)   Collection Time: 11/09/22  4:32 PM   Specimen: BLOOD RIGHT WRIST  Result Value Ref Range Status   Specimen Description BLOOD RIGHT WRIST  Final   Special Requests   Final  BOTTLES DRAWN AEROBIC AND ANAEROBIC Blood Culture adequate volume   Culture   Final    NO GROWTH < 24 HOURS Performed at [facility], [address] [place], [place] [address]    Report Status PENDING  Incomplete  Blood Culture (routine x 2)     Status: None (Preliminary result)   Collection Time: 11/09/22  4:46 PM   Specimen: Right Antecubital; Blood  Result Value Ref Range Status   Specimen Description RIGHT ANTECUBITAL  Final   Special Requests   Final    BOTTLES DRAWN AEROBIC ONLY Blood Culture adequate volume   Culture   Final    NO GROWTH < 24 HOURS Performed at [facility], [address] [place], [place] [address]    Report Status PENDING  Incomplete  Resp panel by RT-PCR (RSV, Flu A&B, Covid) Anterior Nasal Swab     Status: None   Collection Time: 11/09/22  4:54 PM   Specimen: Anterior Nasal Swab  Result Value Ref Range Status   SARS Coronavirus 2 by RT PCR NEGATIVE NEGATIVE Final    Comment: (NOTE) SARS-CoV-2 target nucleic acids are NOT DETECTED.  The SARS-CoV-2 RNA is generally detectable in upper respiratory specimens during the acute phase of infection. The lowest concentration of SARS-CoV-2 viral copies this assay can detect is 138 copies/mL. A negative result does not preclude SARS-Cov-2 infection and should not be used as the sole basis for treatment or other patient management decisions. A negative result may occur with  improper specimen collection/handling, submission of specimen other than nasopharyngeal swab, presence of viral mutation(s) within the areas targeted by this assay, and inadequate number of  viral copies(<138 copies/mL). A negative result must be combined with clinical observations, patient history, and epidemiological information. The expected result is Negative.  Fact Sheet for Patients:  BloggerCourse.com  Fact Sheet for Healthcare Providers:  SeriousBroker.it  This test is no t yet approved or cleared by the [place] FDA and  has been authorized for detection and/or diagnosis of SARS-CoV-2 by FDA under an Emergency Use Authorization (EUA). This EUA will remain  in effect (meaning this test can be used) for the duration of the COVID-19 declaration under Section 564(b)(1) of the Act, 21 U.S.C.section 360bbb-3(b)(1), unless the authorization is terminated  or revoked sooner.       Influenza A by PCR NEGATIVE NEGATIVE Final   Influenza B by PCR NEGATIVE NEGATIVE Final    Comment: (NOTE) The Xpert Xpress SARS-CoV-2/FLU/RSV plus assay is intended as an aid in the diagnosis of influenza from Nasopharyngeal swab specimens and should not be used as a sole basis for treatment. Nasal washings and aspirates are unacceptable for Xpert Xpress SARS-CoV-2/FLU/RSV testing.  Fact Sheet for Patients: BloggerCourse.com  Fact Sheet for Healthcare Providers: SeriousBroker.it  This test is not yet approved or cleared by the [place] FDA and has been authorized for detection and/or diagnosis of SARS-CoV-2 by FDA under an Emergency Use Authorization (EUA). This EUA will remain in effect (meaning this test can be used) for the duration of the COVID-19 declaration under Section 564(b)(1) of the Act, 21 U.S.C. section 360bbb-3(b)(1), unless the authorization is terminated or revoked.     Resp Syncytial Virus by PCR NEGATIVE NEGATIVE Final    Comment: (NOTE) Fact Sheet for Patients: BloggerCourse.com  Fact Sheet for Healthcare  Providers: SeriousBroker.it  This test is not yet approved or cleared by the [place] FDA and has been authorized for detection and/or diagnosis of SARS-CoV-2 by FDA under  an Emergency Use Authorization (EUA). This EUA will remain in effect (meaning this test can be used) for the duration of the COVID-19 declaration under Section 564(b)(1) of the Act, 21 U.S.C. section 360bbb-3(b)(1), unless the authorization is terminated or revoked.  Performed at [facility], [address] [place], [place] [address]   Urine Culture     Status: None   Collection Time: 11/09/22  6:46 PM   Specimen: Urine, Random  Result Value Ref Range Status   Specimen Description   Final    URINE, RANDOM Performed at [facility], [address] [place], [place] [address]    Special Requests   Final    NONE Reflexed from [id number] Performed at [facility], [address] [place], [place] [address]    Culture   Final    NO GROWTH Performed at [facility], [place] [address] [place], [place] [address]    Report Status 11/10/2022 FINAL  Final      Radiology Studies: CT FOREARM LEFT W CONTRAST  Result Date: 11/09/2022 CLINICAL DATA:  Pain red and warm EXAM: CT OF THE UPPER LEFT EXTREMITY WITH CONTRAST TECHNIQUE: Multidetector CT imaging of the upper left extremity was performed according to the standard protocol following intravenous contrast administration. RADIATION DOSE REDUCTION: This exam was performed according to the departmental dose-optimization program which includes automated exposure control, adjustment of the mA and/or kV according to patient size and/or use of iterative reconstruction technique. CONTRAST:  79mL OMNIPAQUE IOHEXOL 300 MG/ML  SOLN COMPARISON:  None Available. FINDINGS: Bones/Joint/Cartilage No fracture or malalignment. No periostitis or osseous destructive change. No significant elbow effusion Ligaments Suboptimally assessed by CT. Muscles and Tendons No  intramuscular fluid collections.  No significant atrophy Soft tissues Skin thickening and subcutaneous edema along the volar aspect of the forearm, worst at the distal third and wrist. No rim enhancing fluid collection to suggest abscess. IMPRESSION: 1. Skin thickening and subcutaneous edema along the volar aspect of the forearm, worst at the distal third of the forearm and wrist, consistent with cellulitis. No rim enhancing fluid collection to suggest abscess. 2. No acute osseous abnormality. Electronically Signed   By: Donavan Foil M.D.   On: 11/09/2022 22:29   DG Chest Port 1 View  Result Date: 11/09/2022 CLINICAL DATA:  Questionable sepsis EXAM: PORTABLE CHEST 1 VIEW COMPARISON:  Chest x-ray June 05, 2016 FINDINGS: The heart size and mediastinal contours are within normal limits. Both lungs are clear. The visualized skeletal structures are unremarkable. IMPRESSION: No active disease. Electronically Signed   By: Dorise Bullion III M.D.   On: 11/09/2022 17:37     Scheduled Meds:  doxycycline  100 mg Oral Q12H   heparin  5,000 Units Subcutaneous Q8H   ibuprofen  600 mg Oral TID   influenza vac split quadrivalent PF  0.5 mL Intramuscular Tomorrow-1000   Continuous Infusions:  cefTRIAXone (ROCEPHIN)  IV 1 g (11/11/22 0322)   lactated ringers       LOS: 1 day    Roxan Hockey M.D on 11/11/2022 at 10:17 AM  Go to [email or web address] - for contact info  [facility] - Office  [phone]  If 7PM-7AM, please contact night-coverage [email or web address] 11/11/2022, 10:17 AM

## 2022-11-11 NOTE — Progress Notes (Signed)
Patient reports pain and discomfort while voiding. Nurse made Dr. Josph Macho aware. No new orders at this time.

## 2022-11-12 DIAGNOSIS — R000 Tachycardia, unspecified: Secondary | ICD-10-CM | POA: Diagnosis not present

## 2022-11-12 DIAGNOSIS — L03114 Cellulitis of left upper limb: Secondary | ICD-10-CM | POA: Diagnosis not present

## 2022-11-12 MED ORDER — ONDANSETRON 4 MG PO TBDP: 4.0000 mg | ORAL_TABLET | Freq: Three times a day (TID) | ORAL | 0 refills | Status: DC | PRN

## 2022-11-12 MED ORDER — ACETAMINOPHEN 325 MG PO TABS: 650.0000 mg | ORAL_TABLET | Freq: Four times a day (QID) | ORAL | 0 refills | Status: AC | PRN

## 2022-11-12 MED ORDER — CEPHALEXIN 500 MG PO CAPS: 500.0000 mg | ORAL_CAPSULE | Freq: Three times a day (TID) | ORAL | 0 refills | Status: AC

## 2022-11-12 MED ORDER — DOXYCYCLINE HYCLATE 100 MG PO TABS: 100.0000 mg | ORAL_TABLET | Freq: Two times a day (BID) | ORAL | 0 refills | Status: AC

## 2022-11-12 MED ORDER — METOCLOPRAMIDE HCL 5 MG/ML IJ SOLN
10.0000 mg | Freq: Once | INTRAMUSCULAR | Status: AC
  Administered 2022-11-12: 10 mg via INTRAVENOUS
  Filled 2022-11-12: qty 2

## 2022-11-12 NOTE — Discharge Summary (Signed)
Tabitha Fitzpatrick, is a 22 y.o. female  DOB 23-Feb-2001  MRN [id number].  Admission date:  11/09/2022  Admitting Physician  Barton Dubois, MD  Discharge Date:  11/12/2022   Primary MD  System, Provider Not In  Recommendations for primary care physician for things to follow:   1)Elevate Left Forearm as much as possible-call if concerns about redness, tenderness, or signs of infection (pain, swelling, redness, odor or green/yellow discharge around Tattoo  site)  2) consider follow-up with primary care provider for recheck in about a week  3) please take and complete antibiotics as prescribed  Admission Diagnosis  Cellulitis [L03.90] Cellulitis of left upper extremity [L03.114] Leukocytosis, unspecified type [D72.829] Left arm cellulitis [L03.114]   Discharge Diagnosis  Cellulitis [L03.90] Cellulitis of left upper extremity [L03.114] Leukocytosis, unspecified type [D72.829] Left arm cellulitis [L03.114]    Principal Problem:   Cellulitis Active Problems:   Sepsis due to Lt UE Cellulitis   Tachycardia   SIRS (systemic inflammatory response syndrome) (HCC)   Left arm cellulitis      Past Medical History:  Diagnosis Date   Anxiety    Asthma    mild, no inhalers at home   Depression    PTSD (post-traumatic stress disorder)     Past Surgical History:  Procedure Laterality Date   ADENOIDECTOMY     age 62 years   TONSILLECTOMY     age 63 years       HPI  from the history and physical done on the day of admission:   HPI: Tabitha Fitzpatrick is a 22 y.o. female with medical history significant of anxiety, asthma, depression, PTSD, POTS presents ED with a chief complaint of arm pain.  Patient recently had a tattoo 3 days ago.  That night she noticed she had increased pain.  She describes the pain as feeling like a bad sunburn.  It has been progressively worse since then.  She wrapped it with Saran  wrap and Aquaphor.  She ran cold water over the Saran wrap when it felt stinging and burning.  She reports has been taking ibuprofen around-the-clock.  She has felt feverish but has not had a measured temp.  She had nausea and vomiting x 2 with pink tinge.  She denies belly pain.  She denies urinary symptoms.  Patient reports that before coming to the ER she did go to urgent care where they prescribed doxycycline, but she has not started that prescription yet.  Patient has no other complaints at this time.   Patient has no allergies to antibiotics.   Patient does not smoke, does not drink, does not use illicit drugs.  She is not vaccinated for COVID.  Patient is full code. Review of Systems: As mentioned in the history of present illness. All other systems reviewed and are negative.   Hospital Course:     Brief Narrative:  22 y.o. female with medical history significant of anxiety, asthma, depression, PTSD, POTS admitted on 11/10/2022 with left arm cellulitis--- sxs started on  11/08/2022 for after getting tattoo on same arm  on 11/07/2022     -Assessment and Plan: 1) sepsis secondary to left arm Cellulitis--POA - -at site of new tattoo on left forearm (sxs started on 11/08/2022 for after getting tattoo on same arm  on 11/07/2022) - CT forearm shows cellulitis with no rim-enhancing fluid collection to suggest abscess -Initially received  vancomycin, cefepime, Flagyl in the ED -Subsequently treated with IV Rocephin and p.o. doxycycline -- Blood and urine cultures NGTD -Okay to discharge on p.o. Keflex and doxycycline -Clinically cellulitis area appears to be improving please see photos in epic   2)Tachycardia - Reports history of POTS -Heart rate improved with improved pain control and IV fluids   3) acute anemia--- suspect hemodilution related - no bleeding concerns  -Repeat CBC in about a week with PCP   Disposition: The patient is from: Home              Anticipated d/c is to:  Home   Discharge Condition: stable  Follow UP--- PCP within a week  Diet and Activity recommendation:  As advised  Discharge Instructions    Discharge Instructions     Call MD for:  difficulty breathing, headache or visual disturbances   Complete by: As directed    Call MD for:  persistant nausea and vomiting   Complete by: As directed    Call MD for:  redness, tenderness, or signs of infection (pain, swelling, redness, odor or green/yellow discharge around incision site)   Complete by: As directed    Call MD for:  temperature >100.4   Complete by: As directed    Diet general   Complete by: As directed    Discharge instructions   Complete by: As directed    1)Elevate Left Forearm as much as possible-call if concerns about redness, tenderness, or signs of infection (pain, swelling, redness, odor or green/yellow discharge around Tattoo  site)  2) consider follow-up with primary care provider for recheck in about a week  3) please take and complete antibiotics as prescribed   Increase activity slowly   Complete by: As directed          Discharge Medications     Allergies as of 11/12/2022   No Active Allergies      Medication List     STOP taking these medications    albuterol 108 (90 Base) MCG/ACT inhaler Commonly known as: VENTOLIN HFA   doxycycline 100 MG capsule Commonly known as: VIBRAMYCIN Replaced by: doxycycline 100 MG tablet   lidocaine 2 % solution Commonly known as: XYLOCAINE       TAKE these medications    acetaminophen 325 MG tablet Commonly known as: TYLENOL Take 2 tablets (650 mg total) by mouth every 6 (six) hours as needed for mild pain, fever or headache. What changed: reasons to take this   cephALEXin 500 MG capsule Commonly known as: Keflex Take 1 capsule (500 mg total) by mouth 3 (three) times daily for 7 days. For cellulitis   doxycycline 100 MG tablet Commonly known as: VIBRA-TABS Take 1 tablet (100 mg total) by mouth 2  (two) times daily for 7 days. For cellulitis Replaces: doxycycline 100 MG capsule   ibuprofen 200 MG tablet Commonly known as: ADVIL Take 600-800 mg by mouth every 6 (six) hours as needed for moderate pain.   ondansetron 4 MG disintegrating tablet Commonly known as: ZOFRAN-ODT Take 1 tablet (4 mg total) by mouth every 8 (eight) hours as needed for  nausea or vomiting.   sertraline 50 MG tablet Commonly known as: Zoloft Take 1.5 tablets (75 mg total) by mouth daily.        Major procedures and Radiology Reports - PLEASE review detailed and final reports for all details, in brief -   CT FOREARM LEFT W CONTRAST  Result Date: 11/09/2022 CLINICAL DATA:  Pain red and warm EXAM: CT OF THE UPPER LEFT EXTREMITY WITH CONTRAST TECHNIQUE: Multidetector CT imaging of the upper left extremity was performed according to the standard protocol following intravenous contrast administration. RADIATION DOSE REDUCTION: This exam was performed according to the departmental dose-optimization program which includes automated exposure control, adjustment of the mA and/or kV according to patient size and/or use of iterative reconstruction technique. CONTRAST:  18mL OMNIPAQUE IOHEXOL 300 MG/ML  SOLN COMPARISON:  None Available. FINDINGS: Bones/Joint/Cartilage No fracture or malalignment. No periostitis or osseous destructive change. No significant elbow effusion Ligaments Suboptimally assessed by CT. Muscles and Tendons No intramuscular fluid collections.  No significant atrophy Soft tissues Skin thickening and subcutaneous edema along the volar aspect of the forearm, worst at the distal third and wrist. No rim enhancing fluid collection to suggest abscess. IMPRESSION: 1. Skin thickening and subcutaneous edema along the volar aspect of the forearm, worst at the distal third of the forearm and wrist, consistent with cellulitis. No rim enhancing fluid collection to suggest abscess. 2. No acute osseous abnormality.  Electronically Signed   By: Donavan Foil M.D.   On: 11/09/2022 22:29   DG Chest Port 1 View  Result Date: 11/09/2022 CLINICAL DATA:  Questionable sepsis EXAM: PORTABLE CHEST 1 VIEW COMPARISON:  Chest x-ray June 05, 2016 FINDINGS: The heart size and mediastinal contours are within normal limits. Both lungs are clear. The visualized skeletal structures are unremarkable. IMPRESSION: No active disease. Electronically Signed   By: Dorise Bullion III M.D.   On: 11/09/2022 17:37    Micro Results   Recent Results (from the past 240 hour(s))  Blood Culture (routine x 2)     Status: None (Preliminary result)   Collection Time: 11/09/22  4:32 PM   Specimen: BLOOD RIGHT WRIST  Result Value Ref Range Status   Specimen Description BLOOD RIGHT WRIST  Final   Special Requests   Final    BOTTLES DRAWN AEROBIC AND ANAEROBIC Blood Culture adequate volume   Culture   Final    NO GROWTH 3 DAYS Performed at [facility], [address], [place], [place] [address]    Report Status PENDING  Incomplete  Blood Culture (routine x 2)     Status: None (Preliminary result)   Collection Time: 11/09/22  4:46 PM   Specimen: Right Antecubital; Blood  Result Value Ref Range Status   Specimen Description RIGHT ANTECUBITAL  Final   Special Requests   Final    BOTTLES DRAWN AEROBIC ONLY Blood Culture adequate volume   Culture   Final    NO GROWTH 3 DAYS Performed at [facility], [address] [place], [place] [address]    Report Status PENDING  Incomplete  Resp panel by RT-PCR (RSV, Flu A&B, Covid) Anterior Nasal Swab     Status: None   Collection Time: 11/09/22  4:54 PM   Specimen: Anterior Nasal Swab  Result Value Ref Range Status   SARS Coronavirus 2 by RT PCR NEGATIVE NEGATIVE Final    Comment: (NOTE) SARS-CoV-2 target nucleic acids are NOT DETECTED.  The SARS-CoV-2 RNA is generally detectable in upper respiratory specimens during the acute  phase of infection. The lowest concentration of SARS-CoV-2  viral copies this assay can detect is 138 copies/mL. A negative result does not preclude SARS-Cov-2 infection and should not be used as the sole basis for treatment or other patient management decisions. A negative result may occur with  improper specimen collection/handling, submission of specimen other than nasopharyngeal swab, presence of viral mutation(s) within the areas targeted by this assay, and inadequate number of viral copies(<138 copies/mL). A negative result must be combined with clinical observations, patient history, and epidemiological information. The expected result is Negative.  Fact Sheet for Patients:  EntrepreneurPulse.com.au  Fact Sheet for Healthcare Providers:  IncredibleEmployment.be  This test is no t yet approved or cleared by the [place] FDA and  has been authorized for detection and/or diagnosis of SARS-CoV-2 by FDA under an Emergency Use Authorization (EUA). This EUA will remain  in effect (meaning this test can be used) for the duration of the COVID-19 declaration under Section 564(b)(1) of the Act, 21 U.S.C.section 360bbb-3(b)(1), unless the authorization is terminated  or revoked sooner.       Influenza A by PCR NEGATIVE NEGATIVE Final   Influenza B by PCR NEGATIVE NEGATIVE Final    Comment: (NOTE) The Xpert Xpress SARS-CoV-2/FLU/RSV plus assay is intended as an aid in the diagnosis of influenza from Nasopharyngeal swab specimens and should not be used as a sole basis for treatment. Nasal washings and aspirates are unacceptable for Xpert Xpress SARS-CoV-2/FLU/RSV testing.  Fact Sheet for Patients: EntrepreneurPulse.com.au  Fact Sheet for Healthcare Providers: IncredibleEmployment.be  This test is not yet approved or cleared by the [place] FDA and has been authorized for detection and/or diagnosis of SARS-CoV-2 by FDA under an Emergency Use Authorization  (EUA). This EUA will remain in effect (meaning this test can be used) for the duration of the COVID-19 declaration under Section 564(b)(1) of the Act, 21 U.S.C. section 360bbb-3(b)(1), unless the authorization is terminated or revoked.     Resp Syncytial Virus by PCR NEGATIVE NEGATIVE Final    Comment: (NOTE) Fact Sheet for Patients: EntrepreneurPulse.com.au  Fact Sheet for Healthcare Providers: IncredibleEmployment.be  This test is not yet approved or cleared by the [place] FDA and has been authorized for detection and/or diagnosis of SARS-CoV-2 by FDA under an Emergency Use Authorization (EUA). This EUA will remain in effect (meaning this test can be used) for the duration of the COVID-19 declaration under Section 564(b)(1) of the Act, 21 U.S.C. section 360bbb-3(b)(1), unless the authorization is terminated or revoked.  Performed at [facility], [address] [place], [place] [address]   Urine Culture     Status: None   Collection Time: 11/09/22  6:46 PM   Specimen: Urine, Random  Result Value Ref Range Status   Specimen Description   Final    URINE, RANDOM Performed at [facility], [address], [place], [place] [address]    Special Requests   Final    NONE Reflexed from [id number] Performed at [facility], [address], [place], [place] [address]    Culture   Final    NO GROWTH Performed at [facility], [place] [address], [place], [place] [address]    Report Status 11/10/2022 FINAL  Final    Today   Subjective    Tabitha Fitzpatrick today has no new complaints  No fever  Or chills  -Nausea on and off  -but no further emesis          Patient has been  seen and examined prior to discharge   Objective   Blood pressure 101/66, pulse 76, temperature (!) 97.1 F (36.2 C), resp. rate 16, height 5\' 5"  (1.651 m), weight 71.6 kg, last menstrual period 10/28/2022, SpO2 100 %.   Intake/Output Summary (Last 24 hours) at  11/12/2022 1231 Last data filed at 11/12/2022 0500 Gross per 24 hour  Intake 960 ml  Output --  Net 960 ml   Exam Gen:- Awake Alert, no acute distress  HEENT:- [place].AT, No sclera icterus Neck-Supple Neck,No JVD,.  Lungs-  CTAB , good air movement bilaterally CV- S1, S2 normal, regular Abd-  +ve B.Sounds, Abd Soft, No tenderness,    Extremity/Skin:-  good pulses, left forearm redness swelling warmth tenderness continues to improve--- no purulent drainage--- see photos in epic Psych-affect is appropriate, oriented x3 Neuro-no new focal deficits, no tremors    Data Review   CBC w Diff:  Lab Results  Component Value Date   WBC 5.0 11/11/2022   HGB 11.2 (L) 11/11/2022   HGB 13.9 11/09/2022   HCT 34.4 (L) 11/11/2022   HCT 40.3 11/09/2022   PLT 146 (L) 11/11/2022   PLT 188 11/09/2022   LYMPHOPCT 14 11/10/2022   MONOPCT 8 11/10/2022   EOSPCT 0 11/10/2022   BASOPCT 0 11/10/2022    CMP:  Lab Results  Component Value Date   NA 138 11/11/2022   NA CANCELED 11/09/2022   K 4.2 11/11/2022   CL 108 11/11/2022   CO2 21 (L) 11/11/2022   BUN 12 11/11/2022   BUN CANCELED 11/09/2022   CREATININE 0.69 11/11/2022   PROT 5.8 (L) 11/10/2022   PROT CANCELED 11/09/2022   ALBUMIN 3.3 (L) 11/10/2022   ALBUMIN CANCELED 11/09/2022   BILITOT 1.3 (H) 11/10/2022   BILITOT CANCELED 11/09/2022   ALKPHOS 36 (L) 11/10/2022   AST 13 (L) 11/10/2022   ALT 12 11/10/2022  . Total Discharge time is about 33 minutes  01/09/2023 M.D on 11/12/2022 at 12:31 PM  Go to [email or web address] -  for contact info  [facility] - Office  [phone]

## 2022-11-12 NOTE — Discharge Instructions (Signed)
1)Elevate Left Forearm as much as possible-call if concerns about redness, tenderness, or signs of infection (pain, swelling, redness, odor or green/yellow discharge around Tattoo  site)  2) consider follow-up with primary care provider for recheck in about a week  3) please take and complete antibiotics as prescribed

## 2022-11-12 NOTE — Progress Notes (Signed)
Pt started to feel nauseous, dangling legs on side of be to vomit.  Then pt notice a small part of her arm starting to bleed.  Nurse cleansed with saline and pat dry.  Arm is very hot to touch.  Pt is in pain and crying.  Notified MD. Will continue to monitor

## 2022-11-14 LAB — CULTURE, BLOOD (ROUTINE X 2)
Culture: NO GROWTH
Culture: NO GROWTH
Special Requests: ADEQUATE
Special Requests: ADEQUATE

## 2023-03-15 ENCOUNTER — Telehealth: Payer: Self-pay | Admitting: Cardiology

## 2023-03-15 NOTE — Telephone Encounter (Signed)
STAT if patient feels like he/she is going to faint   Are you dizzy now?  No   Do you feel faint or have you passed out?  Near syncope last night   Do you have any other symptoms?  CP, SOB, states she couldn't feel her legs or hands last night  Have you checked your HR and BP (record if available)?  180 last night

## 2023-03-15 NOTE — Telephone Encounter (Signed)
Reports on last night having palpitations, lightheadedness, hot, sweaty, chest pain and SOB Reports intermittent chest pain rated 8/10 last night and today 2-3/10.  Denies active dizziness, chest pain or SOB  Reports HR last night ranged from 120-180 & BP 114/50.  Have not checked BP or HR today.  Offered appointment to see Tabitha Fitzpatrick 03/19/2023 but declined saying she had to work on Friday. Scheduled to see Tabitha Fitzpatrick on 03/26/2023 @1 :30 pm. Advised if her symptoms return or she developed worsening symptoms, to go to the ED for an evaluation. Verbalized understanding of plan.

## 2023-03-19 ENCOUNTER — Ambulatory Visit: Payer: PRIVATE HEALTH INSURANCE | Admitting: Nurse Practitioner

## 2023-03-26 ENCOUNTER — Ambulatory Visit: Payer: PRIVATE HEALTH INSURANCE | Attending: Nurse Practitioner | Admitting: Nurse Practitioner

## 2023-03-26 ENCOUNTER — Encounter: Payer: Self-pay | Admitting: *Deleted

## 2023-03-26 ENCOUNTER — Encounter: Payer: Self-pay | Admitting: Nurse Practitioner

## 2023-03-26 VITALS — BP 114/72 | HR 94 | Ht 65.0 in | Wt 151.4 lb

## 2023-03-26 DIAGNOSIS — R079 Chest pain, unspecified: Secondary | ICD-10-CM | POA: Diagnosis not present

## 2023-03-26 DIAGNOSIS — R002 Palpitations: Secondary | ICD-10-CM

## 2023-03-26 DIAGNOSIS — G90A Postural orthostatic tachycardia syndrome (POTS): Secondary | ICD-10-CM

## 2023-03-26 NOTE — Patient Instructions (Signed)
Medication Instructions:   Continue all current medications.   Labwork:  Thyroid panel, ESR, CRP  - orders given  Testing/Procedures:  Your physician has requested that you have an exercise tolerance test. For further information please visit [email or web address] Please also follow instruction sheet, as given.  Your physician has recommended that you wear a 30 day event monitor. Event monitors are medical devices that record the heart's electrical activity. Doctors most often [place] these monitors to diagnose arrhythmias. Arrhythmias are problems with the speed or rhythm of the heartbeat. The monitor is a small, portable device. You can wear one while you do your normal daily activities. This is usually used to diagnose what is causing palpitations/syncope (passing out).  Follow-Up:  Office will contact with results via phone, letter or mychart.    8 weeks   Any Other Special Instructions Will Be Listed Below (If Applicable).   If you need a refill on your cardiac medications before your next appointment, please call your pharmacy.

## 2023-03-26 NOTE — Progress Notes (Unsigned)
Office Visit    Patient Name: Tabitha Fitzpatrick Date of Encounter: 03/26/2023  PCP:  System, Provider Not In   [facility]  Cardiologist:  None *** Advanced Practice Provider:  No care team member to display Electrophysiologist:  None  {Press F2 to show EP APP, CHF, sleep or structural heart MD               :[id number]}  { Click here to update then REFRESH NOTE - MD (PCP) or APP (Team Member)  Change PCP Type for MD, Specialty for APP is either Cardiology or Clinical Cardiac Electrophysiology  :[id number]}  Chief Complaint    Tabitha Fitzpatrick is a 22 y.o. female with a hx of palpitations, anxiety, depression, PTSD, and asthma, who presents today for chest pain and lightheadedness evaluation.  Past Medical History    Past Medical History:  Diagnosis Date   Anxiety    Asthma    mild, no inhalers at home   Depression    PTSD (post-traumatic stress disorder)    Past Surgical History:  Procedure Laterality Date   ADENOIDECTOMY     age 6 years   TONSILLECTOMY     age 42 years    Allergies  No Active Allergies  History of Present Illness    Tabitha Fitzpatrick is a 22 y.o. female with a PMH as mentioned above.  First evaluated by Dr. Dina Rich in 2022 for tachycardia that was ongoing for 2 to 3 months.  She noted episodes of feeling like her heart was racing, dizziness and shortness of breath with chest pain, would often occur with standing and better with laying down, occurred up to twice a week, lasting few minutes to up to 30 minutes.  Apple watch indicated high heart rate up to the 180s.  Dr. Wyline Mood stated that some degree of positional component could suggest a diagnosis of POTS.  Orthostatics showed SBP decline 14 points, no change in DPR.  Heart rate increased from 80-110.  14-day monitor was arranged and revealed average heart rate 96, rare supraventricular ectopy.  Today she presents for chest pain and lightheadedness evaluation.  She  states that ever since her last visit with Dr. Wyline Mood 2 years ago, continues to note episodes sounding like POTS, stands up and gets lightheaded, says she feels drained and notices that her heart rate goes up with this, also notices SHOB, presyncope, and legs feeling numb with these episodes.  Over this past month, she shows me episodes documented on her phone regarding chest pain that is described as dull/aching/throbbing sensation, typically noted at nighttime with either laying down or driving, typically lasting 30 minutes, most recent episode lasted 1 hour that went from right side of her chest to left side of her chest. Rates 3-4 on 0-10 pain scale, episodes becoming worse and more frequent, denies any alleviating/aggravating factors and has not tried any medications. She keeps an eye on her Apple Watch that has noted elevated heart rates at times, 1 time her heart rate was in the 200s.  She has been increasing her salt intake and staying well-hydrated per Dr. Verna Czech last recommendations.  She tells me she drinks about 1 to 2 cups of coffee per day and occasional soda intake. Denies any syncope, orthopnea, PND, swelling or significant weight changes, acute bleeding, or claudication.   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  The ekg ordered today demonstrates ***  Recent Labs: 11/10/2022: ALT 12; Magnesium 2.1 11/11/2022: BUN 12; Creatinine, Ser 0.69; Hemoglobin 11.2; Platelets 146; Potassium 4.2; Sodium 138  Recent Lipid Panel No results found for: "CHOL", "TRIG", "HDL", "CHOLHDL", "VLDL", "LDLCALC", "LDLDIRECT"  Risk Assessment/Calculations:  {Does this patient have ATRIAL FIBRILLATION?:[phone]}  Home Medications   No outpatient medications have been marked as taking for the 03/26/23 encounter (Appointment) with Sharlene Dory, NP.     Review of Systems   ***   All other systems reviewed  and are otherwise negative except as noted above.  Physical Exam    VS:  There were no vitals taken for this visit. , BMI There is no height or weight on file to calculate BMI.  Wt Readings from Last 3 Encounters:  11/09/22 157 lb 13.6 oz (71.6 kg) (86 %, Z= 1.08)*  09/30/22 155 lb (70.3 kg) (84 %, Z= 1.01)*  10/01/21 154 lb 12.8 oz (70.2 kg) (86 %, Z= 1.09)*   * Growth percentiles are based on CDC (Girls, 2-20 Years) data.     GEN: Well nourished, well developed, in no acute distress. HEENT: normal. Neck: Supple, no JVD, carotid bruits, or masses. Cardiac: ***RRR, no murmurs, rubs, or gallops. No clubbing, cyanosis, edema.  ***Radials/PT 2+ and equal bilaterally.  Respiratory:  ***Respirations regular and unlabored, clear to auscultation bilaterally. GI: Soft, nontender, nondistended. MS: No deformity or atrophy. Skin: Warm and dry, no rash. Neuro:  Strength and sensation are intact. Psych: Normal affect.  Assessment & Plan    ***  Informed Consent   Shared Decision Making/Informed Consent{ All outpatient stress tests require an informed consent ([id number]) ATTESTATION ORDER       :[id number]} The risks [chest pain, shortness of breath, cardiac arrhythmias, dizziness, blood pressure fluctuations, myocardial infarction, stroke/transient ischemic attack, and life-threatening complications (estimated to be 1 in 10,000)], benefits (risk stratification, diagnosing coronary artery disease, treatment guidance) and alternatives of an exercise tolerance test were discussed in detail with Ms. Burditt and she agrees to proceed.         Disposition: Follow up {follow up:15908} with None or APP.  Signed, Sharlene Dory, NP 03/26/2023, 12:46 PM [facility]

## 2023-03-28 ENCOUNTER — Telehealth: Payer: Self-pay | Admitting: Nurse Practitioner

## 2023-03-28 NOTE — Telephone Encounter (Signed)
Checking percert on the following patient for testing scheduled at [facility] [place] [facility].    GXT 03/31/2023  -  30 day preventice monitor - palps

## 2023-03-31 ENCOUNTER — Ambulatory Visit (HOSPITAL_COMMUNITY)
Admission: RE | Admit: 2023-03-31 | Discharge: 2023-03-31 | Disposition: A | Discharge status: Home / Self Care | Payer: PRIVATE HEALTH INSURANCE | Source: Ambulatory Visit | Attending: Nurse Practitioner | Admitting: Nurse Practitioner

## 2023-03-31 ENCOUNTER — Other Ambulatory Visit (HOSPITAL_COMMUNITY)
Admission: RE | Admit: 2023-03-31 | Discharge: 2023-03-31 | Disposition: A | Discharge status: Home / Self Care | Payer: PRIVATE HEALTH INSURANCE | Source: Home / Self Care | Attending: Nurse Practitioner | Admitting: Nurse Practitioner

## 2023-03-31 DIAGNOSIS — R002 Palpitations: Secondary | ICD-10-CM | POA: Diagnosis present

## 2023-03-31 DIAGNOSIS — R079 Chest pain, unspecified: Secondary | ICD-10-CM | POA: Insufficient documentation

## 2023-03-31 LAB — EXERCISE TOLERANCE TEST
Angina Index: 0
Base ST Depression (mm): 0 mm
Duke Treadmill Score: 7
Estimated workload: 10.1
Exercise duration (min): 7 min
Exercise duration (sec): 10 s
MPHR: 200 {beats}/min
Peak HR: 179 {beats}/min
Percent HR: 89 %
RPE: 17
Rest HR: 78 {beats}/min
ST Depression (mm): 0 mm

## 2023-03-31 LAB — SEDIMENTATION RATE: Sed Rate: 8 mm/hr (ref 0–22)

## 2023-04-01 LAB — THYROID PANEL WITH TSH
Free Thyroxine Index: 1.7 (ref 1.2–4.9)
T3 Uptake Ratio: 26 % (ref 24–39)
T4, Total: 6.6 ug/dL (ref 4.5–12.0)
TSH: 2.04 u[IU]/mL (ref 0.450–4.500)

## 2023-04-02 LAB — HIGH SENSITIVITY CRP: CRP, High Sensitivity: 0.28 mg/L (ref 0.00–3.00)

## 2023-04-05 ENCOUNTER — Ambulatory Visit: Payer: PRIVATE HEALTH INSURANCE | Attending: Nurse Practitioner

## 2023-04-05 DIAGNOSIS — R002 Palpitations: Secondary | ICD-10-CM

## 2023-04-05 DIAGNOSIS — G90A Postural orthostatic tachycardia syndrome (POTS): Secondary | ICD-10-CM

## 2023-04-05 NOTE — Addendum Note (Signed)
Addended by: Sharen Hones on: 04/05/2023 04:38 PM   Modules accepted: Orders

## 2023-04-06 ENCOUNTER — Telehealth: Payer: Self-pay | Admitting: Cardiology

## 2023-04-06 NOTE — Telephone Encounter (Signed)
Called patient back with results of stress test. Per provider was normal. Patient verbalized understanding. Sent copy to PCP.

## 2023-04-06 NOTE — Telephone Encounter (Signed)
Patient returned call to discuss stress test results.

## 2023-04-15 ENCOUNTER — Telehealth: Payer: Self-pay | Admitting: Nurse Practitioner

## 2023-04-15 NOTE — Telephone Encounter (Signed)
Could not get in touch with patient about heart monitor not having activity for 2 days per [employer] so I reachedout to her father he stated the monitor is dead and has been missing parts since getting it in the mail to put on. Sent EP msg

## 2023-04-16 ENCOUNTER — Telehealth: Payer: Self-pay | Admitting: Nurse Practitioner

## 2023-04-16 NOTE — Telephone Encounter (Signed)
Spoke to father regarding info I got from [employer] and they have fourtounetly acuired plenty of good data! so we are going to stick with the current monitor and not a new one.

## 2023-04-16 NOTE — Telephone Encounter (Signed)
Spoke to Tabitha Fitzpatrick's father she will come in on 04/19/23 to have a new heart monitor out in- ZIO 14 day

## 2023-04-19 ENCOUNTER — Ambulatory Visit: Payer: PRIVATE HEALTH INSURANCE

## 2023-05-17 ENCOUNTER — Ambulatory Visit: Payer: PRIVATE HEALTH INSURANCE | Admitting: Nurse Practitioner

## 2023-05-17 NOTE — Progress Notes (Deleted)
Office Visit    Patient Name: Tabitha Fitzpatrick Date of Encounter: 03/26/2023  PCP:  System, Provider Not In   [facility]  Cardiologist:  Dina Rich, MD  Advanced Practice Provider:  No care team member to display Electrophysiologist:  None   Chief Complaint    Tabitha Fitzpatrick is a 22 y.o. female with a hx of palpitations, anxiety, depression, PTSD, and asthma, who presents today for chest pain and lightheadedness evaluation.  Past Medical History    Past Medical History:  Diagnosis Date   Anxiety    Asthma    mild, no inhalers at home   Depression    PTSD (post-traumatic stress disorder)    Past Surgical History:  Procedure Laterality Date   ADENOIDECTOMY     age 52 years   TONSILLECTOMY     age 39 years    Allergies  Allergies  Allergen Reactions   Morphine Nausea And Vomiting   Oxycodone Nausea And Vomiting    History of Present Illness    Tabitha Fitzpatrick is a 22 y.o. female with a PMH as mentioned above.  First evaluated by Dr. Dina Rich in 2022 for tachycardia that was ongoing for 2 to 3 months.  She noted episodes of feeling like her heart was racing, dizziness and shortness of breath with chest pain, would often occur with standing and better with laying down, occurred up to twice a week, lasting few minutes to up to 30 minutes.  Apple watch indicated high heart rate up to the 180s.  Dr. Wyline Mood stated that some degree of positional component could suggest a diagnosis of POTS.  Orthostatics showed SBP decline 14 points, no change in DPR.  Heart rate increased from 80-110.  14-day monitor was arranged and revealed average heart rate 96, rare supraventricular ectopy.  Today she presents for chest pain and lightheadedness evaluation.  She states that ever since her last visit with Dr. Wyline Mood 2 years ago, continues to note episodes sounding like POTS, stands up and gets lightheaded, says she feels drained and notices that her  heart rate goes up with this, also notices SHOB, presyncope, and legs feeling numb with these episodes.  Over this past month, she shows me episodes documented on her phone regarding chest pain that is described as dull/aching/throbbing sensation, typically noted at nighttime with either laying down or driving, typically lasting 30 minutes, most recent episode lasted 1 hour that went from right side of her chest to left side of her chest. Rates 3-4 on 0-10 pain scale, episodes becoming worse and more frequent, denies any alleviating/aggravating factors and has not tried any medications. She keeps an eye on her Apple Watch that has noted elevated heart rates at times, 1 time her heart rate was in the 200s.  She has been increasing her salt intake and staying well-hydrated per Dr. Verna Czech last recommendations.  She tells me she drinks about 1 to 2 cups of coffee per day and occasional soda intake. Denies any syncope, orthopnea, PND, swelling or significant weight changes, acute bleeding, or claudication.   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  The ekg ordered today demonstrates NSR, 95 bpm, no acute ischemic changes.   Cardiac monitor 02/2022: 14 day monitor Rare supraventricular ectopy in the form of isolated PACs Rare ventricular ectopy in the form of isolated PVCs Min HR 56,  Max HR 191, Avg HR 96 Reported symptoms correlated with sinus rhtyhm and sinus tachycardia     Patch Wear Time:  14 days and 0 hours (2023-03-15T15:15:18-0400 to 2023-03-29T15:15:22-0400)   Patient had a min HR of 56 bpm, max HR of 191 bpm, and avg HR of 96 bpm. Predominant underlying rhythm was Sinus Rhythm. Isolated SVEs were rare (<1.0%, 62), and no SVE Couplets or SVE Triplets were present. Isolated VEs were rare (<1.0%, 9), and no VE  Couplets or VE Triplets were present.   Recent Labs: 11/10/2022: ALT 12; Magnesium  2.1 11/11/2022: BUN 12; Creatinine, Ser 0.69; Hemoglobin 11.2; Platelets 146; Potassium 4.2; Sodium 138 03/31/2023: TSH 2.040  Recent Lipid Panel No results found for: "CHOL", "TRIG", "HDL", "CHOLHDL", "VLDL", "LDLCALC", "LDLDIRECT"  Home Medications   Current Meds  Medication Sig   ibuprofen (ADVIL) 200 MG tablet Take 600-800 mg by mouth every 6 (six) hours as needed for moderate pain.   ondansetron (ZOFRAN-ODT) 4 MG disintegrating tablet Take 1 tablet (4 mg total) by mouth every 8 (eight) hours as needed for nausea or vomiting.   sertraline (ZOLOFT) 50 MG tablet Take 1.5 tablets (75 mg total) by mouth daily. (Patient taking differently: Take 50 mg by mouth daily.)     Review of Systems    All other systems reviewed and are otherwise negative except as noted above.  Physical Exam    VS:  There were no vitals taken for this visit. , BMI There is no height or weight on file to calculate BMI.  Wt Readings from Last 3 Encounters:  03/26/23 151 lb 6.4 oz (68.7 kg)  11/09/22 157 lb 13.6 oz (71.6 kg) (86%, Z= 1.08)*  09/30/22 155 lb (70.3 kg) (84%, Z= 1.01)*   * Growth percentiles are based on CDC (Girls, 2-20 Years) data.     GEN: Well nourished, well developed, in no acute distress. HEENT: normal. Neck: Supple, no JVD, carotid bruits, or masses. Cardiac: S1/S2, RRR, no murmurs, rubs, or gallops. No clubbing, cyanosis, edema.  Radials/PT 2+ and equal bilaterally.  Respiratory:  Respirations regular and unlabored, clear to auscultation bilaterally. GI: Soft, nontender, nondistended. MS: No deformity or atrophy. Skin: Warm and dry, no rash. Neuro:  Strength and sensation are intact. Psych: Normal affect.  Assessment & Plan    Chest pain of uncertain etiology Low suspicion for ACS. Some atypical features. EKG reassuring today. Discussed exercise tolerance test, including risks and benefits, and she is agreeable to proceed. Will arrange. Will also arrange CRP and ESR in addition to  labwork as mentioned below. ED precautions discussed.   Informed Consent   Shared Decision Making/Informed Consent The risks [chest pain, shortness of breath, cardiac arrhythmias, dizziness, blood pressure fluctuations, myocardial infarction, stroke/transient ischemic attack, and life-threatening complications (estimated to be 1 in 10,000)], benefits (risk stratification, diagnosing coronary artery disease, treatment guidance) and alternatives of an exercise tolerance test were discussed in detail with Ms. Kroft and she agrees to proceed.     2. Palpitations, POTS Past orthostatics revealed drop in SBP and increase in HR. Her description of symptoms upon standing sounds like POTs. Past monitor benign. Will arrange 30 event cardiac monitor. Discussed hydration and increasing salt intake. Will write Rx for compression stockings and will obtain thyroid panel. Care precautions (avoiding caffeine/soda intake) and ED precautions discussed. Depending on monitor results, may need to initiate low dose BB/midodrine at next OV.   Disposition: Follow up in 8 week(s) with Dina Rich, MD or APP.  Signed,  Sharlene Dory, NP 05/17/2023, 1:01 PM [facility]

## 2023-06-17 ENCOUNTER — Encounter: Payer: Self-pay | Admitting: *Deleted

## 2023-09-28 ENCOUNTER — Emergency Department (HOSPITAL_COMMUNITY)
Admission: EM | Admit: 2023-09-28 | Discharge: 2023-09-28 | Disposition: A | Discharge status: Home / Self Care | Payer: PRIVATE HEALTH INSURANCE | Attending: Emergency Medicine | Admitting: Emergency Medicine

## 2023-09-28 ENCOUNTER — Other Ambulatory Visit: Payer: Self-pay

## 2023-09-28 ENCOUNTER — Emergency Department (HOSPITAL_COMMUNITY): Payer: PRIVATE HEALTH INSURANCE

## 2023-09-28 ENCOUNTER — Encounter (HOSPITAL_COMMUNITY): Payer: Self-pay

## 2023-09-28 DIAGNOSIS — Z1152 Encounter for screening for COVID-19: Secondary | ICD-10-CM | POA: Insufficient documentation

## 2023-09-28 DIAGNOSIS — K529 Noninfective gastroenteritis and colitis, unspecified: Secondary | ICD-10-CM | POA: Diagnosis not present

## 2023-09-28 DIAGNOSIS — E860 Dehydration: Secondary | ICD-10-CM | POA: Diagnosis not present

## 2023-09-28 DIAGNOSIS — R112 Nausea with vomiting, unspecified: Secondary | ICD-10-CM

## 2023-09-28 DIAGNOSIS — R17 Unspecified jaundice: Secondary | ICD-10-CM

## 2023-09-28 LAB — CBC
HCT: 40 % (ref 36.0–46.0)
Hemoglobin: 14 g/dL (ref 12.0–15.0)
MCH: 34 pg (ref 26.0–34.0)
MCHC: 35 g/dL (ref 30.0–36.0)
MCV: 97.1 fL (ref 80.0–100.0)
Platelets: 204 10*3/uL (ref 150–400)
RBC: 4.12 MIL/uL (ref 3.87–5.11)
RDW: 11.5 % (ref 11.5–15.5)
WBC: 8 10*3/uL (ref 4.0–10.5)
nRBC: 0 % (ref 0.0–0.2)

## 2023-09-28 LAB — URINALYSIS, ROUTINE W REFLEX MICROSCOPIC
Bilirubin Urine: NEGATIVE
Glucose, UA: NEGATIVE mg/dL
Hgb urine dipstick: NEGATIVE
Ketones, ur: 80 mg/dL — AB
Leukocytes,Ua: NEGATIVE
Nitrite: NEGATIVE
Protein, ur: 30 mg/dL — AB
Specific Gravity, Urine: 1.031 — ABNORMAL HIGH (ref 1.005–1.030)
pH: 5 (ref 5.0–8.0)

## 2023-09-28 LAB — BASIC METABOLIC PANEL
Anion gap: 13 (ref 5–15)
BUN: 18 mg/dL (ref 6–20)
CO2: 22 mmol/L (ref 22–32)
Calcium: 9.5 mg/dL (ref 8.9–10.3)
Chloride: 102 mmol/L (ref 98–111)
Creatinine, Ser: 0.71 mg/dL (ref 0.44–1.00)
GFR, Estimated: >60 mL/min (ref >60)
Glucose, Bld: 91 mg/dL (ref 70–99)
Potassium: 3.2 mmol/L — ABNORMAL LOW (ref 3.5–5.1)
Sodium: 137 mmol/L (ref 135–145)

## 2023-09-28 LAB — HEPATIC FUNCTION PANEL
ALT: 18 U/L (ref 0–44)
AST: 18 U/L (ref 15–41)
Albumin: 4.3 g/dL (ref 3.5–5.0)
Alkaline Phosphatase: 41 U/L (ref 38–126)
Bilirubin, Direct: 0.3 mg/dL — ABNORMAL HIGH (ref 0.0–0.2)
Indirect Bilirubin: 1.7 mg/dL — ABNORMAL HIGH (ref 0.3–0.9)
Total Bilirubin: 2 mg/dL — ABNORMAL HIGH (ref <1.2)
Total Protein: 7.3 g/dL (ref 6.5–8.1)

## 2023-09-28 LAB — LIPASE, BLOOD: Lipase: 22 U/L (ref 11–51)

## 2023-09-28 LAB — RESP PANEL BY RT-PCR (RSV, FLU A&B, COVID)  RVPGX2
Influenza A by PCR: NEGATIVE
Influenza B by PCR: NEGATIVE
Resp Syncytial Virus by PCR: NEGATIVE
SARS Coronavirus 2 by RT PCR: NEGATIVE

## 2023-09-28 LAB — HCG, QUANTITATIVE, PREGNANCY: hCG, Beta Chain, Quant, S: <1 m[IU]/mL (ref <5)

## 2023-09-28 LAB — TROPONIN I (HIGH SENSITIVITY): Troponin I (High Sensitivity): <2 ng/L (ref <18)

## 2023-09-28 MED ORDER — IOHEXOL 300 MG/ML  SOLN
100.0000 mL | Freq: Once | INTRAMUSCULAR | Status: AC | PRN
  Administered 2023-09-28: 100 mL via INTRAVENOUS

## 2023-09-28 MED ORDER — ACETAMINOPHEN 500 MG PO TABS
1000.0000 mg | ORAL_TABLET | Freq: Once | ORAL | Status: AC
  Administered 2023-09-28: 1000 mg via ORAL
  Filled 2023-09-28: qty 2

## 2023-09-28 MED ORDER — LACTATED RINGERS IV BOLUS
1000.0000 mL | Freq: Once | INTRAVENOUS | Status: AC
  Administered 2023-09-28: 1000 mL via INTRAVENOUS

## 2023-09-28 MED ORDER — ONDANSETRON HCL 4 MG/2ML IJ SOLN
4.0000 mg | Freq: Once | INTRAMUSCULAR | Status: AC
  Administered 2023-09-28: 4 mg via INTRAVENOUS
  Filled 2023-09-28: qty 2

## 2023-09-28 MED ORDER — ONDANSETRON 4 MG PO TBDP: 4.0000 mg | ORAL_TABLET | Freq: Three times a day (TID) | ORAL | 0 refills | Status: AC | PRN

## 2023-09-28 NOTE — ED Provider Notes (Signed)
[facility]ER[facility] DEPART[facility] AT [facility] Provider Note   CSN: [id number] Arrival date & time: 09/28/23  1759     History  Chief Complaint  Patient presents with   Chest Pain   Emesis   Shortness of Breath   Nausea    Tabitha Fitzpatrick is a 22 y.o. female with history of anxiety, depression, PTSD, who presents with concern for generalized malaise, nausea and vomiting that started yesterday.  States she has been unable to keep food down and is throwing up nonbloody stomach acid.  Reports a pain in her chest that started around 2 PM today and has been constant.  Does not worsen with inspiration, nonexertional.  Reports chills at home, denies any fever.  Denies any OCP use, calf pain or swelling, history of DVT or PE, any recent periods of immobilization or any recent surgeries.   Chest Pain Associated symptoms: shortness of breath and vomiting   Emesis Shortness of Breath Associated symptoms: chest pain and vomiting        Home Medications Prior to Admission medications   Medication Sig Start Date End Date Taking? Authorizing Provider  ondansetron (ZOFRAN-ODT) 4 MG disintegrating tablet Take 1 tablet (4 mg total) by mouth every 8 (eight) hours as needed for nausea or vomiting. 09/28/23  Yes Arabella Merles, PA-C  acetaminophen (TYLENOL) 325 MG tablet Take 2 tablets (650 mg total) by mouth every 6 (six) hours as needed for mild pain, fever or headache. Patient not taking: Reported on 03/26/2023 11/12/22   Shon Hale, MD  busPIRone (BUSPAR) 5 MG tablet Take 5 mg by mouth daily as needed.    [provider]  ibuprofen (ADVIL) 200 MG tablet Take 600-800 mg by mouth every 6 (six) hours as needed for moderate pain.    [provider]  sertraline (ZOLOFT) 50 MG tablet Take 1.5 tablets (75 mg total) by mouth daily. Patient taking differently: Take 50 mg by mouth daily. 08/14/21 03/26/23  Myrlene Broker, MD      Allergies    Morphine and  Oxycodone    Review of Systems   Review of Systems  Respiratory:  Positive for shortness of breath.   Cardiovascular:  Positive for chest pain.  Gastrointestinal:  Positive for vomiting.    Physical Exam Updated Vital Signs BP 104/64   Pulse 99   Temp 99 F (37.2 C) (Oral)   Resp 19   Ht 5\' 5"  (1.651 m)   Wt 72.6 kg   LMP 09/15/2023   SpO2 99%   BMI 26.63 kg/m  Physical Exam Vitals and nursing note reviewed.  Constitutional:      General: She is not in acute distress.    Appearance: She is well-developed.     Comments: No active emesis Appears tired  HENT:     Head: Normocephalic and atraumatic.  Eyes:     Conjunctiva/sclera: Conjunctivae normal.  Cardiovascular:     Rate and Rhythm: Normal rate and regular rhythm.     Heart sounds: No murmur heard. Pulmonary:     Effort: Pulmonary effort is normal. No respiratory distress.     Breath sounds: Normal breath sounds.  Abdominal:     General: Bowel sounds are normal.     Palpations: Abdomen is soft.     Tenderness: There is abdominal tenderness.     Comments: Mild tenderness to palpation diffusely of the abdomen, no rebound or gaurding  Musculoskeletal:        General: No swelling.  Cervical back: Neck supple.  Skin:    General: Skin is warm and dry.     Capillary Refill: Capillary refill takes less than 2 seconds.  Neurological:     Mental Status: She is alert.  Psychiatric:        Mood and Affect: Mood normal.     ED Results / Procedures / Treatments   Labs (all labs ordered are listed, but only abnormal results are displayed) Labs Reviewed  BASIC METABOLIC PANEL - Abnormal; Notable for the following components:      Result Value   Potassium 3.2 (*)    All other components within normal limits  URINALYSIS, ROUTINE W REFLEX MICROSCOPIC - Abnormal; Notable for the following components:   APPearance HAZY (*)    Specific Gravity, Urine 1.031 (*)    Ketones, ur 80 (*)    Protein, ur 30 (*)     Bacteria, UA RARE (*)    All other components within normal limits  HEPATIC FUNCTION PANEL - Abnormal; Notable for the following components:   Total Bilirubin 2.0 (*)    Bilirubin, Direct 0.3 (*)    Indirect Bilirubin 1.7 (*)    All other components within normal limits  RESP PANEL BY RT-PCR (RSV, FLU A&B, COVID)  RVPGX2  CBC  LIPASE, BLOOD  HCG, QUANTITATIVE, PREGNANCY  POC URINE PREG, ED  TROPONIN I (HIGH SENSITIVITY)    EKG EKG Interpretation Date/Time:  Tuesday September 28 2023 18:22:07 EST Ventricular Rate:  99 PR Interval:  132 QRS Duration:  97 QT Interval:  338 QTC Calculation: 434 R Axis:   85  Text Interpretation: Sinus rhythm Confirmed by Alvino Blood ([id number]) on 09/28/2023 6:39:30 PM  Radiology CT ABDOMEN PELVIS W CONTRAST Result Date: 09/28/2023 CLINICAL DATA:  Chest pain with nausea and vomiting. EXAM: CT ABDOMEN AND PELVIS WITH CONTRAST TECHNIQUE: Multidetector CT imaging of the abdomen and pelvis was performed using the standard protocol following bolus administration of intravenous contrast. RADIATION DOSE REDUCTION: This exam was performed according to the departmental dose-optimization program which includes automated exposure control, adjustment of the mA and/or kV according to patient size and/or use of iterative reconstruction technique. CONTRAST:  OMNIPAQUE IOHEXOL 300 MG/ML  SOLN COMPARISON:  July 06, 2012 FINDINGS: Lower chest: No acute abnormality. Hepatobiliary: No focal liver abnormality is seen. No gallstones, gallbladder wall thickening, or biliary dilatation. Pancreas: Unremarkable. No pancreatic ductal dilatation or surrounding inflammatory changes. Spleen: Normal in size without focal abnormality. Adrenals/Urinary Tract: Adrenal glands are unremarkable. Kidneys are normal, without renal calculi, focal lesion, or hydronephrosis. Bladder is unremarkable. Stomach/Bowel: Stomach is within normal limits. Appendix appears normal (best seen on  coronal reformatted images 57 through 66, CT series 4). No evidence of bowel dilatation. Mild thickening of the proximal to mid jejunum is noted. Vascular/Lymphatic: No significant vascular findings are present. No enlarged abdominal or pelvic lymph nodes. Reproductive: The uterus and right adnexa are unremarkable. A 2.1 cm x 1.2 cm x 1.8 cm simple cyst is seen within the left adnexa. Other: No abdominal wall hernia or abnormality. There is a very small amount of posterior pelvic free fluid, likely physiologic. Musculoskeletal: No acute or significant osseous findings. IMPRESSION: 1. Mild thickening of the proximal to mid jejunum which may represent mild enteritis. 2. 2.1 cm x 1.2 cm x 1.8 cm simple cyst within the left adnexa, likely ovarian in origin. No follow-up imaging is recommended. This recommendation follows ACR consensus guidelines: White Paper of the ACR Incidental Findings Committee II on  Adnexal Findings. J Am Coll Radiol [phone]. Electronically Signed   By: Aram Candela M.D.   On: 09/28/2023 21:54   DG Chest 2 View Result Date: 09/28/2023 CLINICAL DATA:  Chest pain and vomiting beginning yesterday. EXAM: CHEST - 2 VIEW COMPARISON:  11/09/2022 FINDINGS: The heart size and mediastinal contours are within normal limits. Both lungs are clear. The visualized skeletal structures are unremarkable. IMPRESSION: Normal exam. Electronically Signed   By: Danae Orleans M.D.   On: 09/28/2023 18:49    Procedures Procedures    Medications Ordered in ED Medications  ondansetron (ZOFRAN) injection 4 mg (4 mg Intravenous Given 09/28/23 1918)  acetaminophen (TYLENOL) tablet 1,000 mg (1,000 mg Oral Given 09/28/23 2030)  lactated ringers bolus 1,000 mL (0 mLs Intravenous Stopped 09/28/23 2223)  iohexol (OMNIPAQUE) 300 MG/ML solution 100 mL (100 mLs Intravenous Contrast Given 09/28/23 2136)    ED Course/ Medical Decision Making/ A&P                                 Medical Decision  Making Amount and/or Complexity of Data Reviewed Labs: ordered. Radiology: ordered.  Risk OTC drugs. Prescription drug management.     Differential diagnosis includes but is not limited to Cholelithiasis, cholangitis, choledocholithiasis, peptic ulcer, gastritis, gastroenteritis, appendicitis, IBS, IBD, DKA, nephrolithiasis, UTI, pyelonephritis, pancreatitis, diverticulitis, mesenteric ischemia, abdominal aortic aneurysm, small bowel obstruction, volvulus, testicular torsion, ovarian torsion, and females of childbearing age pregnancy   ED Course:  Patient tired appearing, but in no acute distress.  Vital signs stable.  She has no active emesis in the room.  Mild abdominal tenderness palpation, but no rebound or guarding.   Chest pain workup shows initial troponin under 2, EKG with normal sinus rhythm, heart score 0, chest x-ray unremarkable, low concern for any cardiac etiology to her chest pain at this time. T. bili elevated at 2, indirect bili elevated at 1.7.  No elevation in AST, ALT, alk phos.  BMP with slight hypokalemia at 3.2.  Lipase within normal limits.  No ultrasound available here, will proceed with CT abdomen pelvis for further evaluation of possible biliary pathology.  Pregnancy negative. Urinalysis showed high specific gravity with ketones and proteinuria.  No nitrates or leukocytes.  Appears patient is dehydrated, started on 1 L LR bolus.  She was given Zofran for nausea and Tylenol for pain. COVID, flu, RSV negative Upon re-evaluation, patient states she feels less nauseous. CT scan shows possible mild enteritis of the jejunum but no other acute abdominal findings. She was able to tolerate PO intake.  Patient stable and appropriate for discharge home at this time  Impression: Nausea and vomiting Elevated bilirubin  Disposition:  The patient was discharged home with instructions to use Zofran at home as needed for nausea.  Keep well-hydrated at home with water.  Increase  dietary intake of potassium. Return precautions given.  Lab Tests: I Ordered, and personally interpreted labs.  The pertinent results include:     Imaging Studies ordered: I ordered imaging studies including chest x-ray, CT abdomen pelvis I independently visualized the imaging with scope of interpretation limited to determining acute life threatening conditions related to emergency care. Imaging showed mild enteritis of the proximal to mid jejunum I agree with the radiologist interpretation   Cardiac Monitoring: / EKG: The patient was maintained on a cardiac monitor.  I personally viewed and interpreted the cardiac monitored which showed an underlying rhythm of: Normal  sinus rhythm               Final Clinical Impression(s) / ED Diagnoses Final diagnoses:  Nausea and vomiting, unspecified vomiting type  Dehydration  Elevated bilirubin  Enteritis    Rx / DC Orders ED Discharge Orders          Ordered    ondansetron (ZOFRAN-ODT) 4 MG disintegrating tablet  Every 8 hours PRN        09/28/23 2224              Arabella Merles, PA-C 09/28/23 2252    Lonell Grandchild, MD 09/29/23 1511

## 2023-09-28 NOTE — ED Triage Notes (Signed)
Pt reports:  Nausea Started yesterday Vomiting Started yesterday Chest pain Started 2pm today SOB When throwing up

## 2023-09-28 NOTE — Discharge Instructions (Addendum)
Your Covid, flu, and RSV is negative today.  Your urine shows that you are dehydrated today.  Please continue to keep well-hydrated at home with water.  Potassium slightly low today at 3.2 (normal between 1.5 and 5.1).  This is likely low due to your recent vomiting.  Increase your dietary intake of potassium at home with food such as bananas, avocados, potatoes.  Your kidney and liver function were normal today.   You did have an elevation in your bilirubin today.  This can be elevated for a variety of reasons including breakdown of your blood cells, problems with your gallbladder and liver.  We do not see any abnormalities on your other liver tests today, though.  Please have this lab value repeated within the next week with your PCP.   Your chest x-ray did not show any abnormalities.  Your troponin which is the cardiac enzyme was normal today.  Your EKG which is a measure of the heart's electrical activity was normal.  Your CT scan showed some inflammation of part of your colon.  It also showed a cyst of your left fallopian tube, this does not need any further evaluation.  Please see your CT read as below:  "IMPRESSION: 1. Mild thickening of the proximal to mid jejunum which may represent mild enteritis. 2. 2.1 cm x 1.2 cm x 1.8 cm simple cyst within the left adnexa, likely ovarian in origin. No follow-up imaging is recommended. This recommendation follows ACR consensus guidelines: White Paper of the ACR Incidental Findings Committee II on Adnexal Findings. J Am Coll Radiol [phone]."  You have been prescribed Zofran for nausea.  May take this every 8 hours as needed for nausea and vomiting.   Return to the ER if you are unable to keep any food or liquids down, you have uncontrolled nausea or vomiting, you develop worsening abdominal pain, any other new or concerning symptoms.

## 2023-09-28 NOTE — ED Notes (Signed)
Patient transported to X-ray 

## 2024-03-30 DIAGNOSIS — W540XXA Bitten by dog, initial encounter: Secondary | ICD-10-CM | POA: Diagnosis not present

## 2024-03-30 DIAGNOSIS — M7989 Other specified soft tissue disorders: Secondary | ICD-10-CM | POA: Diagnosis not present

## 2024-03-30 DIAGNOSIS — S81851A Open bite, right lower leg, initial encounter: Secondary | ICD-10-CM | POA: Diagnosis not present

## 2024-06-23 ENCOUNTER — Encounter: Payer: Self-pay | Admitting: *Deleted
